# Patient Record
Sex: Male | Born: 1995 | Race: Black or African American | Hispanic: No | Marital: Single | Smoking: Former smoker
Health system: Southern US, Community
[De-identification: ages and names within clinical notes are randomized; demographics above are authoritative.]

## PROBLEM LIST (undated history)

## (undated) DIAGNOSIS — Z9109 Other allergy status, other than to drugs and biological substances: Secondary | ICD-10-CM

## (undated) DIAGNOSIS — I10 Essential (primary) hypertension: Secondary | ICD-10-CM

## (undated) HISTORY — PX: COLOSTOMY REVERSAL: SHX5782

## (undated) HISTORY — PX: OTHER SURGICAL HISTORY: SHX169

## (undated) HISTORY — PX: CARDIAC SURGERY: SHX584

## (undated) HISTORY — PX: SMALL INTESTINE SURGERY: SHX150

---
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
## 2016-09-21 ENCOUNTER — Emergency Department (HOSPITAL_COMMUNITY)
Admission: EM | Admit: 2016-09-21 | Discharge: 2016-09-21 | Disposition: A | Discharge status: Home Health | Payer: Medicaid Other | Attending: Emergency Medicine | Admitting: Emergency Medicine

## 2016-09-21 ENCOUNTER — Encounter (HOSPITAL_COMMUNITY): Payer: Self-pay | Admitting: Emergency Medicine

## 2016-09-21 DIAGNOSIS — E119 Type 2 diabetes mellitus without complications: Secondary | ICD-10-CM | POA: Insufficient documentation

## 2016-09-21 DIAGNOSIS — I10 Essential (primary) hypertension: Secondary | ICD-10-CM | POA: Insufficient documentation

## 2016-09-21 DIAGNOSIS — J45909 Unspecified asthma, uncomplicated: Secondary | ICD-10-CM | POA: Insufficient documentation

## 2016-09-21 DIAGNOSIS — Z7722 Contact with and (suspected) exposure to environmental tobacco smoke (acute) (chronic): Secondary | ICD-10-CM | POA: Insufficient documentation

## 2016-09-21 DIAGNOSIS — Z79899 Other long term (current) drug therapy: Secondary | ICD-10-CM | POA: Insufficient documentation

## 2016-09-21 DIAGNOSIS — J069 Acute upper respiratory infection, unspecified: Secondary | ICD-10-CM

## 2016-09-21 NOTE — Discharge Instructions (Signed)
Rest,  Drink plenty of fluids.  You may continue taking your over the counter products as discussed to help with symptoms as this is running its course.

## 2016-09-21 NOTE — ED Triage Notes (Signed)
C/o cough (green), sore throat for last 2 days.

## 2016-09-21 NOTE — ED Provider Notes (Signed)
[facility]-EMERGENCY DEPT Provider Note   CSN: [id number] Arrival date & time: 09/21/16  1715     History   Chief Complaint Chief Complaint  Patient presents with  . Sore Throat    HPI Steve Livingston is a 21 y.o. male  Presenting with a 2 day history of uri type symptoms which includes nasal congestion with clear rhinorrhea, sore throat, and nonproductive cough.  Symptoms do not include shortness of breath, chest pain,  Nausea, vomiting or diarrhea.  The patient has taken nighttime theraflu last night and tylenol severe cold formula today with significant improvement in symptoms.   The history is provided by the patient and a parent.    Past Medical History:  Diagnosis Date  . Asthma   . Diabetes mellitus   . Environmental allergies   . Hypertension     Patient Active Problem List   Diagnosis Date Noted  . Morbid obesity (HCC) 03/13/2012  . Prediabetes 03/13/2012  . Impaired glucose tolerance 03/13/2012  . Acanthosis 03/13/2012  . Gynecomastia 03/13/2012    Past Surgical History:  Procedure Laterality Date  . CARDIAC SURGERY    . SMALL INTESTINE SURGERY    . testicular removal         Home Medications    Prior to Admission medications   Medication Sig Start Date End Date Taking? Authorizing Provider  albuterol (PROVENTIL HFA;VENTOLIN HFA) 108 (90 Base) MCG/ACT inhaler Inhale 2 puffs into the lungs every 4 (four) hours as needed. 09/21/15   Devoria AlbeIva Knapp, MD  budesonide-formoterol (SYMBICORT) 80-4.5 MCG/ACT inhaler Inhale 2 puffs into the lungs 2 (two) times daily. 12/23/14   Hope Orlene OchM Neese, NP  loratadine (CLARITIN) 10 MG tablet Take 1 tablet (10 mg total) by mouth daily. 05/06/14   Jennifer Piepenbrink, PA-C  predniSONE (DELTASONE) 20 MG tablet Take 3 po QD x 3d , then 2 po QD x 3d then 1 po QD x 3d 09/21/15   Devoria AlbeIva Knapp, MD    Family History Family History  Problem Relation Age of Onset  . Hypertension Mother   . Obesity Mother   . Cancer Maternal Grandmother   .  Diabetes Maternal Grandmother   . Diabetes Maternal Grandfather   . Hypertension Maternal Grandfather   . Heart disease Maternal Grandfather   . Stroke Maternal Grandfather     Social History Social History  Substance Use Topics  . Smoking status: Passive Smoke Exposure - Never Smoker  . Smokeless tobacco: Current User     Comment: vapor  . Alcohol use No     Allergies   Bee venom   Review of Systems Review of Systems  Constitutional: Negative for chills and fever.  HENT: Positive for congestion, rhinorrhea and sore throat. Negative for ear pain, sinus pressure, trouble swallowing and voice change.   Eyes: Negative for discharge.  Respiratory: Positive for cough. Negative for shortness of breath, wheezing and stridor.   Cardiovascular: Negative for chest pain.  Gastrointestinal: Negative for abdominal pain.  Genitourinary: Negative.      Physical Exam Updated Vital Signs BP 130/69 (BP Location: Left Arm)   Pulse 88   Temp 98.3 F (36.8 C) (Oral)   Resp 16   Ht 5\' 11"  (1.803 m)   Wt 133.8 kg   SpO2 99%   BMI 41.14 kg/m   Physical Exam  Constitutional: He is oriented to person, place, and time. He appears well-developed and well-nourished.  HENT:  Head: Normocephalic and atraumatic.  Right Ear: Tympanic membrane and ear  canal normal.  Left Ear: Tympanic membrane and ear canal normal.  Nose: Mucosal edema and rhinorrhea present.  Mouth/Throat: Uvula is midline, oropharynx is clear and moist and mucous membranes are normal. No oropharyngeal exudate, posterior oropharyngeal edema, posterior oropharyngeal erythema or tonsillar abscesses.  Eyes: Conjunctivae are normal.  Cardiovascular: Normal rate and normal heart sounds.   Pulmonary/Chest: Effort normal. No respiratory distress. He has no wheezes. He has no rales.  Abdominal: Soft. There is no tenderness.  Musculoskeletal: Normal range of motion.  Neurological: He is alert and oriented to person, place, and time.   Skin: Skin is warm and dry. No rash noted.  Psychiatric: He has a normal mood and affect.     ED Treatments / Results  Labs (all labs ordered are listed, but only abnormal results are displayed) Labs Reviewed - No data to display  EKG  EKG Interpretation None       Radiology No results found.  Procedures Procedures (including critical care time)  Medications Ordered in ED Medications - No data to display   Initial Impression / Assessment and Plan / ED Course  I have reviewed the triage vital signs and the nursing notes.  Pertinent labs & imaging results that were available during my care of the patient were reviewed by me and considered in my medical decision making (see chart for details).     Reassurance given. Rest, increase fluid intake, prn f/u for any worsened sx.  No resp distress, ctab,  Exam c/w viral uri.  Final Clinical Impressions(s) / ED Diagnoses   Final diagnoses:  Upper respiratory tract infection, unspecified type    New Prescriptions New Prescriptions   No medications on file     Burgess Amor, PA-C 09/21/16 1828    Donnetta Hutching, MD 09/23/16 [phone]

## 2017-01-13 ENCOUNTER — Encounter (HOSPITAL_COMMUNITY): Payer: Self-pay

## 2017-01-13 ENCOUNTER — Emergency Department (HOSPITAL_COMMUNITY)
Admission: EM | Admit: 2017-01-13 | Discharge: 2017-01-13 | Disposition: A | Discharge status: Home / Self Care | Payer: BLUE CROSS/BLUE SHIELD | Attending: Emergency Medicine | Admitting: Emergency Medicine

## 2017-01-13 DIAGNOSIS — J4521 Mild intermittent asthma with (acute) exacerbation: Secondary | ICD-10-CM | POA: Diagnosis not present

## 2017-01-13 DIAGNOSIS — E119 Type 2 diabetes mellitus without complications: Secondary | ICD-10-CM | POA: Insufficient documentation

## 2017-01-13 DIAGNOSIS — R0602 Shortness of breath: Secondary | ICD-10-CM | POA: Diagnosis present

## 2017-01-13 DIAGNOSIS — Z7722 Contact with and (suspected) exposure to environmental tobacco smoke (acute) (chronic): Secondary | ICD-10-CM | POA: Insufficient documentation

## 2017-01-13 DIAGNOSIS — I10 Essential (primary) hypertension: Secondary | ICD-10-CM | POA: Diagnosis not present

## 2017-01-13 LAB — CBG MONITORING, ED: GLUCOSE-CAPILLARY: 104 mg/dL — AB (ref 65–99)

## 2017-01-13 MED ORDER — ALBUTEROL SULFATE (2.5 MG/3ML) 0.083% IN NEBU
5.0000 mg | INHALATION_SOLUTION | Freq: Once | RESPIRATORY_TRACT | Status: AC
  Administered 2017-01-13: 5 mg via RESPIRATORY_TRACT
  Filled 2017-01-13: qty 6

## 2017-01-13 MED ORDER — AEROCHAMBER Z-STAT PLUS/MEDIUM MISC
1.0000 | Freq: Once | Status: DC
  Filled 2017-01-13: qty 1

## 2017-01-13 MED ORDER — ALBUTEROL SULFATE HFA 108 (90 BASE) MCG/ACT IN AERS
2.0000 | INHALATION_SPRAY | Freq: Four times a day (QID) | RESPIRATORY_TRACT | Status: DC | PRN
  Filled 2017-01-13: qty 6.7

## 2017-01-13 MED ORDER — IPRATROPIUM BROMIDE 0.02 % IN SOLN
0.5000 mg | Freq: Once | RESPIRATORY_TRACT | Status: AC
  Administered 2017-01-13: 0.5 mg via RESPIRATORY_TRACT
  Filled 2017-01-13: qty 2.5

## 2017-01-13 MED ORDER — PREDNISONE 50 MG PO TABS
60.0000 mg | ORAL_TABLET | Freq: Once | ORAL | Status: AC
  Administered 2017-01-13: 60 mg via ORAL
  Filled 2017-01-13: qty 1

## 2017-01-13 MED ORDER — PREDNISONE 20 MG PO TABS
ORAL_TABLET | ORAL | 0 refills | Status: DC
Start: 2017-01-13 — End: 2017-09-09

## 2017-01-13 MED ORDER — ALBUTEROL SULFATE (2.5 MG/3ML) 0.083% IN NEBU
INHALATION_SOLUTION | RESPIRATORY_TRACT | Status: AC
  Administered 2017-01-13: 01:00:00
  Filled 2017-01-13: qty 6

## 2017-01-13 NOTE — ED Notes (Signed)
Pt alert & oriented x4, stable gait. Patient given discharge instructions, paperwork & prescription(s). Patient  instructed to stop at the registration desk to finish any additional paperwork. Patient verbalized understanding. Pt left department w/ no further questions. 

## 2017-01-13 NOTE — ED Notes (Signed)
Pt ambulated around the nurses station. O2 sats remained 95 to 98%

## 2017-01-13 NOTE — Discharge Instructions (Signed)
Use the inhaler 2 puffs every 6 hrs as needed for wheezing or shortness of breath. Take the prednisone until gone. Recheck if you get worse again.

## 2017-01-13 NOTE — ED Triage Notes (Signed)
Pt states he got hot at work with no air conditioning, states he started feeling sob, went home but did not improve.

## 2017-01-13 NOTE — ED Notes (Signed)
ED Provider at bedside. 

## 2017-01-13 NOTE — ED Provider Notes (Signed)
[facility]-EMERG[facility]Y DEPT Provider Note   CSN: [id number] Arrival date & time: 01/13/17  0040  By signing my name below, I, Doreatha Martin, attest that this documentation has been prepared under the direction and in the presence of Devoria Albe, MD. Electronically Signed: Doreatha Martin, ED Scribe. 01/13/17. 12:58 AM.   Time seen: 12:53 AM    History   Chief Complaint Chief Complaint  Patient presents with  . Shortness of Breath    HPI Steve Livingston is a 21 y.o. male with h/o asthma, borderline diabetes who presents to the Emergency Department complaining of gradually worsening SOB x 1 week with associated non-productive cough, sneezing. He states his SOB is unaffected by exertion. Pt states his current symptoms are consistent with prior asthma exacerbations, stating his symptoms are normally improved with inhalers. 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 He states he does not have a nebulizer or inhaler at home to use at this time. Pt states he has flare ups a few times a year, usually when it is hot. Pt is a non-smoker. He denies rhinorrhea, sore throat, nausea, vomiting, chest pain.    PCP- [facility]  The history is provided by the patient. No language interpreter was used.    Past Medical History:  Diagnosis Date  . Asthma   . Diabetes mellitus   . Environmental allergies   . Hypertension     Patient Active Problem List   Diagnosis Date Noted  . Morbid obesity (HCC) 03/13/2012  . Prediabetes 03/13/2012  . Impaired glucose tolerance 03/13/2012  . Acanthosis 03/13/2012  . Gynecomastia 03/13/2012    Past Surgical History:  Procedure Laterality Date  . CARDIAC SURGERY    . SMALL INTESTINE SURGERY    . testicular removal         Home Medications    Prior to Admission medications   Medication Sig Start Date End Date Taking? Authorizing Provider  albuterol (PROVENTIL HFA;VENTOLIN HFA) 108 (90 Base) MCG/ACT inhaler Inhale 2 puffs into the lungs every 4 (four)  hours as needed. 09/21/15   Devoria Albe, MD  budesonide-formoterol (SYMBICORT) 80-4.5 MCG/ACT inhaler Inhale 2 puffs into the lungs 2 (two) times daily. 12/23/14   Janne Napoleon, NP  loratadine (CLARITIN) 10 MG tablet Take 1 tablet (10 mg total) by mouth daily. 05/06/14   Piepenbrink, Victorino Dike, PA-C  predniSONE (DELTASONE) 20 MG tablet Take 3 po QD x 3d , then 2 po QD x 3d then 1 po QD x 3d 01/13/17   Devoria Albe, MD    Family History Family History  Problem Relation Age of Onset  . Hypertension Mother   . Obesity Mother   . Cancer Maternal Grandmother   . Diabetes Maternal Grandmother   . Diabetes Maternal Grandfather   . Hypertension Maternal Grandfather   . Heart disease Maternal Grandfather   . Stroke Maternal Grandfather     Social History Social History  Substance Use Topics  . Smoking status: Passive Smoke Exposure - Never Smoker  . Smokeless tobacco: Current User     Comment: vapor  . Alcohol use Yes  employed   Allergies   Bee venom   Review of Systems Review of Systems  HENT: Positive for sneezing. Negative for rhinorrhea and sore throat.   Respiratory: Positive for cough and shortness of breath.   Gastrointestinal: Negative for nausea and vomiting.  All other systems reviewed and are negative.    Physical Exam Updated Vital Signs BP 130/82 (BP Location: Left  Arm)   Pulse 74   Temp 97.9 F (36.6 C) (Oral)   Resp 18   Ht 5\' 11"  (1.803 m)   Wt (!) 330 lb (149.7 kg)   SpO2 98%   BMI 46.03 kg/m   Vital signs normal    Physical Exam  Constitutional: He is oriented to person, place, and time. He appears well-developed and well-nourished.  Non-toxic appearance. He does not appear ill. No distress.  HENT:  Head: Normocephalic and atraumatic.  Right Ear: External ear normal.  Left Ear: External ear normal.  Nose: Nose normal. No mucosal edema or rhinorrhea.  Mouth/Throat: Oropharynx is clear and moist and mucous membranes are normal. No dental abscesses or  uvula swelling.  Eyes: Conjunctivae and EOM are normal. Pupils are equal, round, and reactive to light.  Neck: Normal range of motion and full passive range of motion without pain. Neck supple.  Cardiovascular: Normal rate, regular rhythm and normal heart sounds.  Exam reveals no gallop and no friction rub.   No murmur heard. Pulmonary/Chest: Effort normal. No accessory muscle usage. No tachypnea. No respiratory distress. He has decreased breath sounds. He has wheezes. He has no rhonchi. He has no rales. He exhibits no tenderness and no crepitus.  Diminished breath sounds and scattered expiratory wheezing.   Abdominal: Soft. Normal appearance and bowel sounds are normal. He exhibits no distension. There is no tenderness. There is no rebound and no guarding.  Musculoskeletal: Normal range of motion. He exhibits no edema or tenderness.  Moves all extremities well.   Neurological: He is alert and oriented to person, place, and time. He has normal strength. No cranial nerve deficit.  Skin: Skin is warm, dry and intact. No rash noted. No erythema. No pallor.  Psychiatric: He has a normal mood and affect. His speech is normal and behavior is normal. His mood appears not anxious.  Nursing note and vitals reviewed.    ED Treatments / Results  Labs (all labs ordered are listed, but only abnormal results are displayed) Labs Reviewed  CBG MONITORING, ED - Abnormal; Notable for the following:       Result Value   Glucose-Capillary 104 (*)    All other components within normal limits   Laboratory interpretation all normal   EKG  EKG Interpretation None       Radiology No results found.  Procedures Procedures (including critical care time)  Medications Ordered in ED Medications  albuterol (PROVENTIL HFA;VENTOLIN HFA) 108 (90 Base) MCG/ACT inhaler 2 puff (not administered)  aerochamber Z-Stat Plus/medium 1 each (not administered)  albuterol (PROVENTIL) (2.5 MG/3ML) 0.083% nebulizer  solution 5 mg (5 mg Nebulization Given 01/13/17 0111)  ipratropium (ATROVENT) nebulizer solution 0.5 mg (0.5 mg Nebulization Given 01/13/17 0111)  albuterol (PROVENTIL) (2.5 MG/3ML) 0.083% nebulizer solution (  Given 01/13/17 0113)  albuterol (PROVENTIL) (2.5 MG/3ML) 0.083% nebulizer solution 5 mg (5 mg Nebulization Given 01/13/17 0146)  ipratropium (ATROVENT) nebulizer solution 0.5 mg (0.5 mg Nebulization Given 01/13/17 0146)  predniSONE (DELTASONE) tablet 60 mg (60 mg Oral Given 01/13/17 0135)     Initial Impression / Assessment and Plan / ED Course  I have reviewed the triage vital signs and the nursing notes.  Pertinent labs & imaging results that were available during my care of the patient were reviewed by me and considered in my medical decision making (see chart for details).     DIAGNOSTIC STUDIES: Oxygen Saturation is 98% on RA, normal by my interpretation.    COORDINATION OF  CARE: 12:57 AM Discussed treatment plan with pt at bedside which includes breathing tx and pt agreed to plan. Will check his CBG prior to starting steroids.     Recheck 01:25 AM Patient states is feeling better, when I listen to him he has some improvement in the air movement however he still has expiratory wheezing especially in the upper lobes bilaterally. A second nebulizer was ordered. I reviewed his CBG which was good, he was started on oral prednisone.  Recheck 2:40 AM patient is resting comfortably, he states he feels much improved. On exam he has good air movement and no longer has any wheezing. He was ambulated by nursing staff and his pulse ox remained 95-98% on room air. Patient was given an albuterol inhaler and spacer to use and he was instructed on how to use it by respiratory therapy. He was discharged home on prednisone.  Final Clinical Impressions(s) / ED Diagnoses   Final diagnoses:  Mild intermittent asthma with exacerbation    New Prescriptions New Prescriptions   PREDNISONE (DELTASONE)  20 MG TABLET    Take 3 po QD x 3d , then 2 po QD x 3d then 1 po QD x 3d   Plan discharge  Devoria Albe, MD, FACEP    I personally performed the services described in this documentation, which was scribed in my presence. The recorded information has been reviewed and considered.  Devoria Albe, MD, Concha Pyo, MD 01/13/17 [id number]

## 2017-01-29 ENCOUNTER — Encounter (HOSPITAL_COMMUNITY): Payer: Self-pay | Admitting: *Deleted

## 2017-01-29 ENCOUNTER — Emergency Department (HOSPITAL_COMMUNITY)
Admission: EM | Admit: 2017-01-29 | Discharge: 2017-01-29 | Disposition: A | Discharge status: Home / Self Care | Payer: BLUE CROSS/BLUE SHIELD | Attending: Emergency Medicine | Admitting: Emergency Medicine

## 2017-01-29 DIAGNOSIS — H1031 Unspecified acute conjunctivitis, right eye: Secondary | ICD-10-CM | POA: Diagnosis not present

## 2017-01-29 DIAGNOSIS — I10 Essential (primary) hypertension: Secondary | ICD-10-CM | POA: Insufficient documentation

## 2017-01-29 DIAGNOSIS — H579 Unspecified disorder of eye and adnexa: Secondary | ICD-10-CM | POA: Diagnosis present

## 2017-01-29 DIAGNOSIS — F17200 Nicotine dependence, unspecified, uncomplicated: Secondary | ICD-10-CM | POA: Insufficient documentation

## 2017-01-29 DIAGNOSIS — E119 Type 2 diabetes mellitus without complications: Secondary | ICD-10-CM | POA: Diagnosis not present

## 2017-01-29 DIAGNOSIS — J45909 Unspecified asthma, uncomplicated: Secondary | ICD-10-CM | POA: Diagnosis not present

## 2017-01-29 MED ORDER — TOBRAMYCIN 0.3 % OP SOLN: 1.0000 [drp] | OPHTHALMIC | 0 refills | Status: DC

## 2017-01-29 NOTE — ED Triage Notes (Signed)
Pt c/o right eye redness and drainage that started yesterday.

## 2017-01-29 NOTE — Discharge Instructions (Signed)
Return if any problems.

## 2017-01-29 NOTE — ED Provider Notes (Signed)
[facility] Provider Note   CSN: [id number] Arrival date & time: 01/29/17  1144     History   Chief Complaint Chief Complaint  Patient presents with  . Eye Drainage    HPI Steve Livingston is a 21 y.o. male.  The history is provided by the patient. No language interpreter was used.  Eye Pain  This is a new problem. The problem occurs constantly. The problem has been gradually worsening. Nothing aggravates the symptoms. Nothing relieves the symptoms. He has tried nothing for the symptoms. The treatment provided no relief.  Pt complains of eye redness and swelling   Past Medical History:  Diagnosis Date  . Asthma   . Diabetes mellitus   . Environmental allergies   . Hypertension     Patient Active Problem List   Diagnosis Date Noted  . Morbid obesity (HCC) 03/13/2012  . Prediabetes 03/13/2012  . Impaired glucose tolerance 03/13/2012  . Acanthosis 03/13/2012  . Gynecomastia 03/13/2012    Past Surgical History:  Procedure Laterality Date  . CARDIAC SURGERY    . SMALL INTESTINE SURGERY    . testicular removal         Home Medications    Prior to Admission medications   Medication Sig Start Date End Date Taking? Authorizing Provider  albuterol (PROVENTIL HFA;VENTOLIN HFA) 108 (90 Base) MCG/ACT inhaler Inhale 2 puffs into the lungs every 4 (four) hours as needed. 09/21/15   Devoria Albe, MD  budesonide-formoterol (SYMBICORT) 80-4.5 MCG/ACT inhaler Inhale 2 puffs into the lungs 2 (two) times daily. 12/23/14   Janne Napoleon, NP  loratadine (CLARITIN) 10 MG tablet Take 1 tablet (10 mg total) by mouth daily. 05/06/14   Piepenbrink, Victorino Dike, PA-C  predniSONE (DELTASONE) 20 MG tablet Take 3 po QD x 3d , then 2 po QD x 3d then 1 po QD x 3d 01/13/17   Devoria Albe, MD  tobramycin (TOBREX) 0.3 % ophthalmic solution Place 1 drop into the right eye every 4 (four) hours. 01/29/17   Elson Areas, PA-C    Family History Family History  Problem Relation Age of Onset  .  Hypertension Mother   . Obesity Mother   . Cancer Maternal Grandmother   . Diabetes Maternal Grandmother   . Diabetes Maternal Grandfather   . Hypertension Maternal Grandfather   . Heart disease Maternal Grandfather   . Stroke Maternal Grandfather     Social History Social History  Substance Use Topics  . Smoking status: Passive Smoke Exposure - Never Smoker  . Smokeless tobacco: Current User     Comment: vapor  . Alcohol use Yes     Allergies   Bee venom   Review of Systems Review of Systems  Eyes: Positive for pain.  All other systems reviewed and are negative.    Physical Exam Updated Vital Signs BP 135/79 (BP Location: Right Arm)   Pulse 81   Temp 97.7 F (36.5 C) (Oral)   Resp 20   Ht 5\' 11"  (1.803 m)   Wt (!) 149.7 kg (330 lb)   SpO2 97%   BMI 46.03 kg/m   Physical Exam  Constitutional: He appears well-developed and well-nourished.  HENT:  Head: Normocephalic and atraumatic.  Eyes: Conjunctivae are normal.  Injected conjunctiva, exudate corner of eye.  Neck: Neck supple.  Cardiovascular: Normal rate and regular rhythm.   No murmur heard. Pulmonary/Chest: Effort normal and breath sounds normal. No respiratory distress.  Abdominal: There is no tenderness.  Musculoskeletal: He exhibits no  edema.  Neurological: He is alert.  Skin: Skin is warm and dry.  Psychiatric: He has a normal mood and affect.  Nursing note and vitals reviewed.    ED Treatments / Results  Labs (all labs ordered are listed, but only abnormal results are displayed) Labs Reviewed - No data to display  EKG  EKG Interpretation None       Radiology No results found.  Procedures Procedures (including critical care time)  Medications Ordered in ED Medications - No data to display   Initial Impression / Assessment and Plan / ED Course  I have reviewed the triage vital signs and the nursing notes.  Pertinent labs & imaging results that were available during my care  of the patient were reviewed by me and considered in my medical decision making (see chart for details).       Final Clinical Impressions(s) / ED Diagnoses   Final diagnoses:  Acute conjunctivitis of right eye, unspecified acute conjunctivitis type    New Prescriptions Discharge Medication List as of 01/29/2017  1:16 PM    START taking these medications   Details  tobramycin (TOBREX) 0.3 % ophthalmic solution Place 1 drop into the right eye every 4 (four) hours., Starting Sun 01/29/2017, Print      An After Visit Summary was printed and given to the patient.   Elson AreasSofia, Amorita Vanrossum K, [place] JerseyPA-C 01/29/17 1351    Mesner, Barbara CowerJason, MD 01/29/17 [phone]

## 2017-01-29 NOTE — ED Notes (Signed)
Right eye  20/50 Left eye    20/40 

## 2017-09-09 ENCOUNTER — Emergency Department (HOSPITAL_COMMUNITY): Payer: Self-pay

## 2017-09-09 ENCOUNTER — Inpatient Hospital Stay (HOSPITAL_COMMUNITY)
Admission: EM | Admit: 2017-09-09 | Discharge: 2017-09-11 | DRG: 202 | Disposition: A | Discharge status: Home / Self Care | Payer: Self-pay | Attending: Internal Medicine | Admitting: Internal Medicine

## 2017-09-09 ENCOUNTER — Other Ambulatory Visit: Payer: Self-pay

## 2017-09-09 ENCOUNTER — Encounter (HOSPITAL_COMMUNITY): Payer: Self-pay | Admitting: *Deleted

## 2017-09-09 DIAGNOSIS — J45909 Unspecified asthma, uncomplicated: Secondary | ICD-10-CM | POA: Diagnosis present

## 2017-09-09 DIAGNOSIS — R51 Headache: Secondary | ICD-10-CM | POA: Diagnosis present

## 2017-09-09 DIAGNOSIS — R000 Tachycardia, unspecified: Secondary | ICD-10-CM | POA: Diagnosis present

## 2017-09-09 DIAGNOSIS — I493 Ventricular premature depolarization: Secondary | ICD-10-CM | POA: Diagnosis present

## 2017-09-09 DIAGNOSIS — R7303 Prediabetes: Secondary | ICD-10-CM | POA: Diagnosis present

## 2017-09-09 DIAGNOSIS — Z91030 Bee allergy status: Secondary | ICD-10-CM

## 2017-09-09 DIAGNOSIS — Z6841 Body Mass Index (BMI) 40.0 and over, adult: Secondary | ICD-10-CM

## 2017-09-09 DIAGNOSIS — J45901 Unspecified asthma with (acute) exacerbation: Principal | ICD-10-CM | POA: Diagnosis present

## 2017-09-09 DIAGNOSIS — Z79899 Other long term (current) drug therapy: Secondary | ICD-10-CM

## 2017-09-09 DIAGNOSIS — Z720 Tobacco use: Secondary | ICD-10-CM

## 2017-09-09 DIAGNOSIS — Z23 Encounter for immunization: Secondary | ICD-10-CM

## 2017-09-09 DIAGNOSIS — R9431 Abnormal electrocardiogram [ECG] [EKG]: Secondary | ICD-10-CM | POA: Diagnosis present

## 2017-09-09 DIAGNOSIS — R509 Fever, unspecified: Secondary | ICD-10-CM | POA: Diagnosis present

## 2017-09-09 DIAGNOSIS — I10 Essential (primary) hypertension: Secondary | ICD-10-CM | POA: Diagnosis present

## 2017-09-09 DIAGNOSIS — R7302 Impaired glucose tolerance (oral): Secondary | ICD-10-CM | POA: Diagnosis present

## 2017-09-09 DIAGNOSIS — J4521 Mild intermittent asthma with (acute) exacerbation: Secondary | ICD-10-CM

## 2017-09-09 DIAGNOSIS — E66813 Obesity, class 3: Secondary | ICD-10-CM

## 2017-09-09 DIAGNOSIS — J9601 Acute respiratory failure with hypoxia: Secondary | ICD-10-CM

## 2017-09-09 DIAGNOSIS — Z7951 Long term (current) use of inhaled steroids: Secondary | ICD-10-CM

## 2017-09-09 LAB — CBC WITH DIFFERENTIAL/PLATELET
BASOS ABS: 0 10*3/uL (ref 0.0–0.1)
BASOS PCT: 0 %
Eosinophils Absolute: 0 10*3/uL (ref 0.0–0.7)
Eosinophils Relative: 0 %
HEMATOCRIT: 41.8 % (ref 39.0–52.0)
HEMOGLOBIN: 13.3 g/dL (ref 13.0–17.0)
Lymphocytes Relative: 7 %
Lymphs Abs: 0.4 10*3/uL — ABNORMAL LOW (ref 0.7–4.0)
MCH: 28.1 pg (ref 26.0–34.0)
MCHC: 31.8 g/dL (ref 30.0–36.0)
MCV: 88.2 fL (ref 78.0–100.0)
MONO ABS: 0.1 10*3/uL (ref 0.1–1.0)
Monocytes Relative: 2 %
NEUTROS ABS: 5.7 10*3/uL (ref 1.7–7.7)
Neutrophils Relative %: 91 %
Platelets: 238 10*3/uL (ref 150–400)
RBC: 4.74 MIL/uL (ref 4.22–5.81)
RDW: 13.3 % (ref 11.5–15.5)
WBC: 6.2 10*3/uL (ref 4.0–10.5)

## 2017-09-09 LAB — COMPREHENSIVE METABOLIC PANEL
ALBUMIN: 4.2 g/dL (ref 3.5–5.0)
ALK PHOS: 96 U/L (ref 38–126)
ALT: 20 U/L (ref 17–63)
AST: 24 U/L (ref 15–41)
Anion gap: 13 (ref 5–15)
BILIRUBIN TOTAL: 0.9 mg/dL (ref 0.3–1.2)
BUN: 8 mg/dL (ref 6–20)
CALCIUM: 9.1 mg/dL (ref 8.9–10.3)
CO2: 25 mmol/L (ref 22–32)
CREATININE: 0.85 mg/dL (ref 0.61–1.24)
Chloride: 99 mmol/L — ABNORMAL LOW (ref 101–111)
GFR calc Af Amer: >60 mL/min (ref >60)
GLUCOSE: 131 mg/dL — AB (ref 65–99)
POTASSIUM: 3.6 mmol/L (ref 3.5–5.1)
Sodium: 137 mmol/L (ref 135–145)
TOTAL PROTEIN: 8.5 g/dL — AB (ref 6.5–8.1)

## 2017-09-09 LAB — CBG MONITORING, ED: GLUCOSE-CAPILLARY: 78 mg/dL (ref 65–99)

## 2017-09-09 LAB — MAGNESIUM: MAGNESIUM: 1.9 mg/dL (ref 1.7–2.4)

## 2017-09-09 MED ORDER — INFLUENZA VAC SPLIT QUAD 0.5 ML IM SUSY
0.5000 mL | PREFILLED_SYRINGE | INTRAMUSCULAR | Status: AC
  Administered 2017-09-10: 0.5 mL via INTRAMUSCULAR
  Filled 2017-09-09: qty 0.5

## 2017-09-09 MED ORDER — IPRATROPIUM BROMIDE 0.02 % IN SOLN: 0.5000 mg | Freq: Four times a day (QID) | RESPIRATORY_TRACT | Status: DC

## 2017-09-09 MED ORDER — ONDANSETRON HCL 4 MG PO TABS: 4.0000 mg | ORAL_TABLET | Freq: Four times a day (QID) | ORAL | Status: DC | PRN

## 2017-09-09 MED ORDER — IPRATROPIUM-ALBUTEROL 0.5-2.5 (3) MG/3ML IN SOLN
3.0000 mL | Freq: Once | RESPIRATORY_TRACT | Status: AC
  Administered 2017-09-09: 3 mL via RESPIRATORY_TRACT
  Filled 2017-09-09: qty 3

## 2017-09-09 MED ORDER — PNEUMOCOCCAL VAC POLYVALENT 25 MCG/0.5ML IJ INJ
0.5000 mL | INJECTION | INTRAMUSCULAR | Status: AC
  Administered 2017-09-10: 0.5 mL via INTRAMUSCULAR
  Filled 2017-09-09: qty 0.5

## 2017-09-09 MED ORDER — METHYLPREDNISOLONE SODIUM SUCC 125 MG IJ SOLR
125.0000 mg | Freq: Two times a day (BID) | INTRAMUSCULAR | Status: DC
  Administered 2017-09-09: 125 mg via INTRAVENOUS
  Filled 2017-09-09: qty 2

## 2017-09-09 MED ORDER — ALBUTEROL SULFATE (2.5 MG/3ML) 0.083% IN NEBU
2.5000 mg | INHALATION_SOLUTION | Freq: Once | RESPIRATORY_TRACT | Status: AC
  Administered 2017-09-09: 2.5 mg via RESPIRATORY_TRACT
  Filled 2017-09-09: qty 3

## 2017-09-09 MED ORDER — ALBUTEROL SULFATE (2.5 MG/3ML) 0.083% IN NEBU: 2.5000 mg | INHALATION_SOLUTION | RESPIRATORY_TRACT | Status: DC | PRN

## 2017-09-09 MED ORDER — IPRATROPIUM-ALBUTEROL 0.5-2.5 (3) MG/3ML IN SOLN
3.0000 mL | Freq: Four times a day (QID) | RESPIRATORY_TRACT | Status: DC
  Administered 2017-09-09 – 2017-09-10 (×4): 3 mL via RESPIRATORY_TRACT
  Filled 2017-09-09 (×4): qty 3

## 2017-09-09 MED ORDER — ALBUTEROL (5 MG/ML) CONTINUOUS INHALATION SOLN
10.0000 mg/h | INHALATION_SOLUTION | Freq: Once | RESPIRATORY_TRACT | Status: AC
  Administered 2017-09-09: 10 mg/h via RESPIRATORY_TRACT
  Filled 2017-09-09: qty 20

## 2017-09-09 MED ORDER — HYDROXYZINE HCL 25 MG PO TABS
50.0000 mg | ORAL_TABLET | Freq: Every evening | ORAL | Status: DC | PRN
  Administered 2017-09-09: 50 mg via ORAL
  Filled 2017-09-09: qty 2

## 2017-09-09 MED ORDER — ONDANSETRON HCL 4 MG/2ML IJ SOLN: 4.0000 mg | Freq: Four times a day (QID) | INTRAMUSCULAR | Status: DC | PRN

## 2017-09-09 MED ORDER — ALBUTEROL SULFATE HFA 108 (90 BASE) MCG/ACT IN AERS
2.0000 | INHALATION_SPRAY | Freq: Once | RESPIRATORY_TRACT | Status: DC
  Filled 2017-09-09: qty 6.7

## 2017-09-09 MED ORDER — MAGNESIUM SULFATE 2 GM/50ML IV SOLN
2.0000 g | Freq: Once | INTRAVENOUS | Status: AC
  Administered 2017-09-09: 2 g via INTRAVENOUS
  Filled 2017-09-09: qty 50

## 2017-09-09 MED ORDER — ALBUTEROL SULFATE (2.5 MG/3ML) 0.083% IN NEBU: 2.5000 mg | INHALATION_SOLUTION | Freq: Four times a day (QID) | RESPIRATORY_TRACT | Status: DC

## 2017-09-09 MED ORDER — ENOXAPARIN SODIUM 80 MG/0.8ML ~~LOC~~ SOLN
80.0000 mg | SUBCUTANEOUS | Status: DC
  Administered 2017-09-09: 23:00:00 80 mg via SUBCUTANEOUS
  Filled 2017-09-09: qty 0.8

## 2017-09-09 MED ORDER — PREDNISONE 10 MG PO TABS
60.0000 mg | ORAL_TABLET | Freq: Once | ORAL | Status: AC
  Administered 2017-09-09: 60 mg via ORAL
  Filled 2017-09-09: qty 1

## 2017-09-09 MED ORDER — GUAIFENESIN ER 600 MG PO TB12
600.0000 mg | ORAL_TABLET | Freq: Two times a day (BID) | ORAL | Status: DC
  Administered 2017-09-09 – 2017-09-11 (×4): 600 mg via ORAL
  Filled 2017-09-09 (×4): qty 1

## 2017-09-09 MED ORDER — POTASSIUM CHLORIDE IN NACL 20-0.45 MEQ/L-% IV SOLN
INTRAVENOUS | Status: AC
  Administered 2017-09-09 – 2017-09-10 (×2): via INTRAVENOUS
  Filled 2017-09-09 (×4): qty 1000

## 2017-09-09 MED ORDER — ALBUTEROL SULFATE (2.5 MG/3ML) 0.083% IN NEBU: 10.0000 mg | INHALATION_SOLUTION | Freq: Once | RESPIRATORY_TRACT | Status: DC

## 2017-09-09 NOTE — ED Provider Notes (Signed)
  Patient is a 22 year old male with a known history of asthma, complaining of cough, shortness of breath with wheezing for 2 days.  Signed out to me by Burgess AmorJulie Idol, PA-C at end of shift.    Patient was receiving hour-long albuterol nebulizer treatment.  He has received oral prednisone.  1815 patient has completed albuterol treatment, on my assessment lung sounds are slightly diminished with few expiratory wheezes.  Patient states that he is feeling better and requesting discharge home.  On ambulation in the department his O2 sats on RA remain at the 88-90% range.    1850 patient also seen by Dr. Hyacinth MeekerMiller, patient initially requesting to be discharged home, but on further discussion patient now agrees to admission.  1925 Consulted hospitalist, Dr. Robb Matarrtiz, who agrees to admit.      Pauline Ausriplett, Reisa Coppola, PA-C 09/09/17 Serena Croissant1928    Eber HongMiller, Brian, MD 09/10/17 1500

## 2017-09-09 NOTE — ED Triage Notes (Signed)
Pt with productive cough for 2 days, green in color.  Unknown of fever, denies N/V/D.  Decrease appetite

## 2017-09-09 NOTE — ED Notes (Signed)
Sats 88-92% on RA during ambulation.  Denies SOB.  Says, "I feel better.  HR 120's.

## 2017-09-09 NOTE — ED Notes (Signed)
Sats 86-91% on RA during ambulation.  Denies SOB.

## 2017-09-09 NOTE — ED Provider Notes (Signed)
[facility] EMERGENCY DEPARTMENT Provider Note   CSN: [id number] Arrival date & time: 09/09/17  1331     History   Chief Complaint Chief Complaint  Patient presents with  . Cough    HPI Steve Livingston is a 22 y.o. male with a history of asthma and borderline DM, diet controlled,  but without current medications for this presenting with a 2 day history of cough productive of green thick sputum, shortness of breath and wheezing with exertion and possible fevers, stating he felt hot a few times with coughing. He also reports nasal congestion also with purulent nasal discharge.  He denies hemoptysis.  He has had no chest pain, denies n/v or abdominal pain.  He reports feeling lightheaded earlier today with ambulation and sob.  He has had no treatment prior to arrival, stating he tried using his albuterol but it was empty.  The history is provided by the patient.    Past Medical History:  Diagnosis Date  . Asthma   . Diabetes mellitus   . Environmental allergies   . Hypertension     Patient Active Problem List   Diagnosis Date Noted  . Morbid obesity (HCC) 03/13/2012  . Prediabetes 03/13/2012  . Impaired glucose tolerance 03/13/2012  . Acanthosis 03/13/2012  . Gynecomastia 03/13/2012    Past Surgical History:  Procedure Laterality Date  . CARDIAC SURGERY    . SMALL INTESTINE SURGERY    . testicular removal         Home Medications    Prior to Admission medications   Medication Sig Start Date End Date Taking? Authorizing Provider  albuterol (PROVENTIL HFA;VENTOLIN HFA) 108 (90 Base) MCG/ACT inhaler Inhale 2 puffs into the lungs every 4 (four) hours as needed. 09/21/15   Devoria Albe, MD  budesonide-formoterol (SYMBICORT) 80-4.5 MCG/ACT inhaler Inhale 2 puffs into the lungs 2 (two) times daily. 12/23/14   Janne Napoleon, NP  loratadine (CLARITIN) 10 MG tablet Take 1 tablet (10 mg total) by mouth daily. 05/06/14   Piepenbrink, Victorino Dike, PA-C  predniSONE (DELTASONE) 20 MG  tablet Take 3 po QD x 3d , then 2 po QD x 3d then 1 po QD x 3d 01/13/17   Devoria Albe, MD  tobramycin (TOBREX) 0.3 % ophthalmic solution Place 1 drop into the right eye every 4 (four) hours. 01/29/17   Elson Areas, PA-C    Family History Family History  Problem Relation Age of Onset  . Hypertension Mother   . Obesity Mother   . Cancer Maternal Grandmother   . Diabetes Maternal Grandmother   . Diabetes Maternal Grandfather   . Hypertension Maternal Grandfather   . Heart disease Maternal Grandfather   . Stroke Maternal Grandfather     Social History Social History   Tobacco Use  . Smoking status: Passive Smoke Exposure - Never Smoker  . Smokeless tobacco: Current User  . Tobacco comment: vapor  Substance Use Topics  . Alcohol use: Yes  . Drug use: No     Allergies   Bee venom   Review of Systems Review of Systems  Constitutional: Negative for fever.  HENT: Positive for congestion and rhinorrhea. Negative for sore throat.   Eyes: Negative.   Respiratory: Positive for chest tightness, shortness of breath and wheezing.   Cardiovascular: Negative for chest pain.  Gastrointestinal: Negative for abdominal pain, nausea and vomiting.  Genitourinary: Negative.   Musculoskeletal: Negative for arthralgias, joint swelling and neck pain.  Skin: Negative.  Negative for rash and wound.  Neurological: Positive for light-headedness. Negative for dizziness, weakness, numbness and headaches.  Psychiatric/Behavioral: Negative.      Physical Exam Updated Vital Signs BP 116/64   Pulse (!) 108   Temp 99.6 F (37.6 C) (Oral)   Resp 20   Ht 5\' 11"  (1.803 m)   Wt (!) 149.7 kg (330 lb)   SpO2 93%   BMI 46.03 kg/m   Physical Exam  Constitutional: He appears well-developed and well-nourished.  HENT:  Head: Normocephalic and atraumatic.  Eyes: Conjunctivae are normal.  Neck: Normal range of motion.  Cardiovascular: Regular rhythm, normal heart sounds and intact distal pulses.  Tachycardia present.  Pulmonary/Chest: Effort normal. He has decreased breath sounds. He has wheezes in the right upper field. He has no rhonchi. He has no rales.  Decreased breath sounds throughout all lung fields.  There is a faint wheeze in his right upper lung field heard best with coughing.  Abdominal: Soft. Bowel sounds are normal. There is no tenderness.  Musculoskeletal: Normal range of motion.  Neurological: He is alert.  Skin: Skin is warm and dry.  Psychiatric: He has a normal mood and affect.  Nursing note and vitals reviewed.    ED Treatments / Results  Labs (all labs ordered are listed, but only abnormal results are displayed) Labs Reviewed  CBG MONITORING, ED    EKG  EKG Interpretation None       Radiology Dg Chest 2 View  Result Date: 09/09/2017 CLINICAL DATA:  Productive cough for 2 days. No reported fever or vomiting. History of asthma and diabetes. EXAM: CHEST  2 VIEW COMPARISON:  09/21/2015. FINDINGS: The heart size and mediastinal contours are stable. There is possible mild infrahilar scarring on the left, similar to the previous study. No hyperinflation, confluent airspace opacity, pleural effusion or pneumothorax. No acute osseous findings. IMPRESSION: Stable chest.  No acute cardiopulmonary process. Electronically Signed   By: Carey BullocksWilliam  Veazey M.D.   On: 09/09/2017 14:31    Procedures Procedures (including critical care time)  Medications Ordered in ED Medications  albuterol (PROVENTIL) (2.5 MG/3ML) 0.083% nebulizer solution 2.5 mg (2.5 mg Nebulization Given 09/09/17 1442)  ipratropium-albuterol (DUONEB) 0.5-2.5 (3) MG/3ML nebulizer solution 3 mL (3 mLs Nebulization Given 09/09/17 1442)  predniSONE (DELTASONE) tablet 60 mg (60 mg Oral Given 09/09/17 1608)  albuterol (PROVENTIL,VENTOLIN) solution continuous neb (10 mg/hr Nebulization Given 09/09/17 1654)     Initial Impression / Assessment and Plan / ED Course  I have reviewed the triage vital signs and  the nursing notes.  Pertinent labs & imaging results that were available during my care of the patient were reviewed by me and considered in my medical decision making (see chart for details).     Patient was given an albuterol and Atrovent nebulizer treatment.  He states he felt improved after this treatment.  On exam, I did not appreciate any significant improvement in aeration, no wheezing heard.  He was ambulated in the department and he had saturations in the 88-92% range.  He also is tachycardic, although has just had an albuterol treatment.  He was given prednisone 60 mg p.o. and an hour-long albuterol neb was ordered.  Discussed patient with Pauline Ausammy Triplett PA who will follow pt.  Final Clinical Impressions(s) / ED Diagnoses   Final diagnoses:  Moderate asthma with exacerbation, unspecified whether persistent    ED Discharge Orders    None       Victoriano Laindol, Joesph Marcy, PA-C 09/09/17 1759    Eber HongMiller, Brian, MD 09/09/17  1900    Eber Hong, MD 09/10/17 1500

## 2017-09-09 NOTE — Progress Notes (Signed)
Respiratory Care Noted: Notified of breathing treatment for patient. Attempted to get clarification on treatment. 10 mg is usually given as an continuous neb treatment over one hour.

## 2017-09-09 NOTE — ED Notes (Signed)
Report given to Sheena, RN.

## 2017-09-09 NOTE — ED Notes (Signed)
In to reassess pt.  Dr Hyacinth MeekerMiller has spoken with pt and informed Dr Hyacinth MeekerMiller that he wants to go home.  Mother in room with pt and pt decided to stay overnight.  Tammy Triplett informed.

## 2017-09-09 NOTE — H&P (Signed)
History and Physical    Steve Livingston WUJ:[id number] DOB: Feb 19, 1996 DOA: 09/09/2017  PCP: Patient, No Pcp Per   Patient coming from: Home.  I have personally briefly reviewed patient's old medical records in [facility]  Chief Complaint: Shortness of breath.  HPI: Steve Livingston is a 22 y.o. male with medical history significant of asthma, prediabetes/DM, environmental allergies, hypertension, morbid obesity is coming to the emergency department with complaints of productive cough of greenish sputum for the past 2 days associated with dyspnea, wheezing, fatigue and decreased appetite.  He denies fever, chills, headache, rhinorrhea, but states he had a sore throat several days ago and has nasal congestion now.  He denies travel history or sick contacts.  No chest pain, palpitations, dizziness, diaphoresis, PND, orthopnea or pitting edema lower extremities.  No abdominal pain, nausea, emesis, diarrhea, constipation, melena or hematochezia.  Denies dysuria, frequency or hematuria.  Denies blurred vision, polyphagia, polydipsia or polyuria.  He denies skin rashes.  ED Course: Initial vital signs temperature 99.58F (37.6 C), pulse 108, respirations 20, blood pressure 116/64 mmHg and O2 sat 95% on room air.  His workup shows a normal CBC.  CMP shows a chloride of 99 mmol/L and glucose of 131 mg/dL, but was otherwise normal.  His chest radiograph did not show any acute cardiopulmonary pathology.  However EKG shows sinus tach at 100 bpm, with minimal ST depression and borderline ST elevation in lateral leads.  Treatment in the ED.  The patient received supplemental oxygen, a regular DuoNeb and 1 hour continuous neb treatment plus 60 mg of prednisone p.o. x1  Review of Systems: As per HPI otherwise 10 point review of systems negative.    Past Medical History:  Diagnosis Date  . Asthma   . Diabetes mellitus   . Environmental allergies   . Hypertension     Past Surgical History:  Procedure  Laterality Date  . CARDIAC SURGERY    . SMALL INTESTINE SURGERY    . testicular removal       reports that he is a non-smoker but has been exposed to tobacco smoke. He uses smokeless tobacco. He reports that he drinks alcohol. He reports that he does not use drugs.  Allergies  Allergen Reactions  . Bee Venom Swelling    Family History  Problem Relation Age of Onset  . Hypertension Mother   . Obesity Mother   . Cancer Maternal Grandmother   . Diabetes Maternal Grandmother   . Diabetes Maternal Grandfather   . Hypertension Maternal Grandfather   . Heart disease Maternal Grandfather   . Stroke Maternal Grandfather     Prior to Admission medications   Medication Sig Start Date End Date Taking? Authorizing Provider  albuterol (PROVENTIL HFA;VENTOLIN HFA) 108 (90 Base) MCG/ACT inhaler Inhale 2 puffs into the lungs every 4 (four) hours as needed. 09/21/15  Yes Devoria Albe, MD    Physical Exam: Vitals:   09/09/17 1337 09/09/17 1443 09/09/17 1656 09/09/17 1841  BP: 116/64   117/71  Pulse: (!) 108   (!) 107  Resp: 20   18  Temp: 99.6 F (37.6 C)     TempSrc: Oral     SpO2: 95% 96% 93% (!) 88%  Weight:      Height:        Constitutional: NAD, calm, comfortable Eyes: PERRL, lids and conjunctivae normal ENMT: Mucous membranes are moist. Posterior pharynx clear of any exudate or lesions. Neck: normal, supple, no masses, no thyromegaly Respiratory: Decreased breath  sounds on bases, but no wheezing, no crackles. Normal respiratory effort. No accessory muscle use.  Cardiovascular: Regular rate and rhythm, no murmurs / rubs / gallops. No extremity edema. 2+ pedal pulses. No carotid bruits.  Abdomen: Obese, soft, no tenderness, no masses palpated. No hepatosplenomegaly. Bowel sounds positive.  Musculoskeletal: no clubbing / cyanosis. No joint deformity upper and lower extremities. Good ROM, no contractures. Normal muscle tone.  Skin: no rashes, lesions, ulcers on limited dermatological  exam. Neurologic: CN 2-12 grossly intact. Sensation intact, DTR normal. Strength 5/5 in all 4.  Psychiatric: Normal judgment and insight. Alert and oriented x 4. Normal mood.    Labs on Admission: I have personally reviewed following labs and imaging studies  CBC: No results for input(s): WBC, NEUTROABS, HGB, HCT, MCV, PLT in the last 168 hours. Basic Metabolic Panel: No results for input(s): NA, K, CL, CO2, GLUCOSE, BUN, CREATININE, CALCIUM, MG, PHOS in the last 168 hours. GFR: CrCl cannot be calculated (No order found.). Liver Function Tests: No results for input(s): AST, ALT, ALKPHOS, BILITOT, PROT, ALBUMIN in the last 168 hours. No results for input(s): LIPASE, AMYLASE in the last 168 hours. No results for input(s): AMMONIA in the last 168 hours. Coagulation Profile: No results for input(s): INR, PROTIME in the last 168 hours. Cardiac Enzymes: No results for input(s): CKTOTAL, CKMB, CKMBINDEX, TROPONINI in the last 168 hours. BNP (last 3 results) No results for input(s): PROBNP in the last 8760 hours. HbA1C: No results for input(s): HGBA1C in the last 72 hours. CBG: Recent Labs  Lab 09/09/17 1607  GLUCAP 78   Lipid Profile: No results for input(s): CHOL, HDL, LDLCALC, TRIG, CHOLHDL, LDLDIRECT in the last 72 hours. Thyroid Function Tests: No results for input(s): TSH, T4TOTAL, FREET4, T3FREE, THYROIDAB in the last 72 hours. Anemia Panel: No results for input(s): VITAMINB12, FOLATE, FERRITIN, TIBC, IRON, RETICCTPCT in the last 72 hours. Urine analysis: No results found for: COLORURINE, APPEARANCEUR, LABSPEC, PHURINE, GLUCOSEU, HGBUR, BILIRUBINUR, KETONESUR, PROTEINUR, UROBILINOGEN, NITRITE, LEUKOCYTESUR  Radiological Exams on Admission: Dg Chest 2 View  Result Date: 09/09/2017 CLINICAL DATA:  Productive cough for 2 days. No reported fever or vomiting. History of asthma and diabetes. EXAM: CHEST  2 VIEW COMPARISON:  09/21/2015. FINDINGS: The heart size and mediastinal  contours are stable. There is possible mild infrahilar scarring on the left, similar to the previous study. No hyperinflation, confluent airspace opacity, pleural effusion or pneumothorax. No acute osseous findings. IMPRESSION: Stable chest.  No acute cardiopulmonary process. Electronically Signed   By: Carey BullocksWilliam  Veazey M.D.   On: 09/09/2017 14:31    EKG: Independently reviewed.  Vent. rate 100 BPM PR interval * ms QRS duration 103 ms QT/QTc 346/447 ms P-R-T axes 67 37 4 Sinus tachycardia Minimal ST depression Borderline ST elevation, lateral leads No previous to compare to.  Assessment/Plan Principal Problem:   Asthma exacerbation Observation/telemetry. Continue supplemental oxygen. Albuterol 2.5+ ipratropium 0.5 mg neb every 6 hours scheduled. Albuterol 2.5 mg neb every 12 hours as needed. He has received 60 mg of prednisone in the ED. Will give Solu-Medrol 125 mg IVP every 6 hours x2 doses starting at midnight. Close blood glucose monitoring and control while on glucocorticoids.  Active Problems:   Prediabetes Carbohydrate modified diet. CBG monitoring with regular insulin sliding scale while on steroids. Check hemoglobin A1c. Advised to establish with a new PCP. Told about the need to get yearly microalbuminuria and retinal checks. Advised to continue with New Year's resolutions in regards to lifestyle changes.  Hypertension Currently not on medical therapy. Monitor blood pressure.    Abnormal EKG Asymptomatic. Advised to follow up with PCP. Should consider outpatient cardiology evaluation.    DVT prophylaxis: Lovenox SQ. Code Status: Full code. Family Communication:  Disposition Plan: Observation for asthma exacerbation treatment. Consults called:  Admission status: Inpatient/telemetry.   Bobette Mo MD [facility] Pager [phone].  If 7PM-7AM, please contact night-coverage [email or web address] Password [facility]  09/09/2017, 7:49 PM

## 2017-09-10 DIAGNOSIS — R7303 Prediabetes: Secondary | ICD-10-CM

## 2017-09-10 DIAGNOSIS — R9431 Abnormal electrocardiogram [ECG] [EKG]: Secondary | ICD-10-CM

## 2017-09-10 DIAGNOSIS — J45901 Unspecified asthma with (acute) exacerbation: Principal | ICD-10-CM

## 2017-09-10 DIAGNOSIS — J9600 Acute respiratory failure, unspecified whether with hypoxia or hypercapnia: Secondary | ICD-10-CM

## 2017-09-10 DIAGNOSIS — J9601 Acute respiratory failure with hypoxia: Secondary | ICD-10-CM

## 2017-09-10 DIAGNOSIS — R7309 Other abnormal glucose: Secondary | ICD-10-CM

## 2017-09-10 LAB — RESPIRATORY PANEL BY PCR
ADENOVIRUS-RVPPCR: NOT DETECTED
Bordetella pertussis: NOT DETECTED
CORONAVIRUS 229E-RVPPCR: NOT DETECTED
CORONAVIRUS NL63-RVPPCR: NOT DETECTED
CORONAVIRUS OC43-RVPPCR: NOT DETECTED
Chlamydophila pneumoniae: NOT DETECTED
Coronavirus HKU1: NOT DETECTED
Influenza A: NOT DETECTED
Influenza B: NOT DETECTED
MYCOPLASMA PNEUMONIAE-RVPPCR: NOT DETECTED
Metapneumovirus: NOT DETECTED
PARAINFLUENZA VIRUS 1-RVPPCR: NOT DETECTED
Parainfluenza Virus 2: NOT DETECTED
Parainfluenza Virus 3: NOT DETECTED
Parainfluenza Virus 4: NOT DETECTED
Respiratory Syncytial Virus: NOT DETECTED
Rhinovirus / Enterovirus: NOT DETECTED

## 2017-09-10 LAB — BASIC METABOLIC PANEL
ANION GAP: 10 (ref 5–15)
BUN: 9 mg/dL (ref 6–20)
CO2: 25 mmol/L (ref 22–32)
Calcium: 9.1 mg/dL (ref 8.9–10.3)
Chloride: 101 mmol/L (ref 101–111)
Creatinine, Ser: 0.76 mg/dL (ref 0.61–1.24)
GLUCOSE: 159 mg/dL — AB (ref 65–99)
POTASSIUM: 4.5 mmol/L (ref 3.5–5.1)
Sodium: 136 mmol/L (ref 135–145)

## 2017-09-10 LAB — GLUCOSE, CAPILLARY
GLUCOSE-CAPILLARY: 119 mg/dL — AB (ref 65–99)
GLUCOSE-CAPILLARY: 201 mg/dL — AB (ref 65–99)
GLUCOSE-CAPILLARY: 163 mg/dL — AB (ref 65–99)
GLUCOSE-CAPILLARY: 126 mg/dL — AB (ref 65–99)

## 2017-09-10 LAB — HEMOGLOBIN A1C
HEMOGLOBIN A1C: 5.7 % — AB (ref 4.8–5.6)
Mean Plasma Glucose: 116.89 mg/dL

## 2017-09-10 LAB — MAGNESIUM: Magnesium: 2.4 mg/dL (ref 1.7–2.4)

## 2017-09-10 MED ORDER — INSULIN ASPART 100 UNIT/ML ~~LOC~~ SOLN
0.0000 [IU] | Freq: Three times a day (TID) | SUBCUTANEOUS | Status: DC
  Administered 2017-09-10: 3 [IU] via SUBCUTANEOUS
  Administered 2017-09-10: 5 [IU] via SUBCUTANEOUS
  Administered 2017-09-11: 2 [IU] via SUBCUTANEOUS

## 2017-09-10 MED ORDER — BUDESONIDE 0.5 MG/2ML IN SUSP
0.5000 mg | Freq: Two times a day (BID) | RESPIRATORY_TRACT | Status: DC
  Administered 2017-09-10 – 2017-09-11 (×3): 0.5 mg via RESPIRATORY_TRACT
  Filled 2017-09-10 (×3): qty 2

## 2017-09-10 MED ORDER — METHYLPREDNISOLONE SODIUM SUCC 125 MG IJ SOLR
80.0000 mg | Freq: Three times a day (TID) | INTRAMUSCULAR | Status: DC
  Administered 2017-09-10 – 2017-09-11 (×4): 80 mg via INTRAVENOUS
  Filled 2017-09-10 (×4): qty 2

## 2017-09-10 MED ORDER — ENOXAPARIN SODIUM 80 MG/0.8ML ~~LOC~~ SOLN
70.0000 mg | SUBCUTANEOUS | Status: DC
  Administered 2017-09-10: 70 mg via SUBCUTANEOUS
  Filled 2017-09-10: qty 0.8

## 2017-09-10 NOTE — Discharge Summary (Addendum)
Physician Discharge Summary  Steve HakimZachary Livingston VHQ:[id number] DOB: May 14, 1996 DOA: 09/09/2017  PCP: Patient, No Pcp Per  Admit date: 09/09/2017 Discharge date: 09/11/2017  Admitted From: Home Disposition:  Home   Recommendations for Outpatient Follow-up:  1. Follow up with PCP in 1-2 weeks 2. Please obtain BMP/CBC in one week    Discharge Condition: Stable CODE STATUS: FULL Diet recommendation: Heart Healthy   Brief/Interim Summary: 22 year old male with a history of asthma, impaired glucose tolerance, and elevated blood pressure presenting with 5-6 days of coughing, sore throat, and shortness of breath.  He stated that it began around 09/04/2017 with sore throat and coughing and subsequently progressing to dyspnea on exertion.  Over the next 4-5 days, his symptoms continue to progress to the point where he was having difficulty walking without shortness of breath.  The patient also had some headaches without any neurologic changes.  The patient had some subjective fevers and chills, but denied any nausea, vomiting, diarrhea, abdominal pain, dysuria, hematuria.  In the emergency department, the patient had a low-grade fever and mild tachycardia.  He received albuterol and Solu-Medrol.  However, he desaturated to 88% with ambulation.  As result, admission was recommended.  The patient was started on intravenous Solu-Medrol and bronchodilators with clinical improvement.  On the morning of discharge,I ambulated the patient on room air without desaturation less than 88%.    Discharge Diagnoses:   Acute respiratory failure with hypoxia -Presently stable on 1 L nasal cannula>>RA -Secondary to asthma exacerbation -Wean oxygen back to room air as tolerated for saturation greater than 92% -ambulatory pulse ox on RA--did not show any oxygen desaturation less than 88%.  Asthma exacerbation -Respiratory viral panel--negative -Started Pulmicort -Continue IV Solu-Medrol>>>home with prednisone  taper -Continue bronchodilators -Personally reviewed chest x-ray--increased interstitial markings, no consolidation -check mag--2.4  Impaired glucose tolerance -Check hemoglobin A1c--5.7 -NovoLog sliding scale while on steroids -consult nutrtition  Abnormal EKG  -Likely due to early repolarization -No chest pain -Personally reviewed EKG--sinus rhythm, nonspecific ST-T wave changes  Morbid Obesity  -BMI 41.65     Discharge Instructions   Allergies as of 09/11/2017      Reactions   Bee Venom Swelling      Medication List    TAKE these medications   albuterol 108 (90 Base) MCG/ACT inhaler Commonly known as:  PROVENTIL HFA;VENTOLIN HFA Inhale 2 puffs into the lungs every 4 (four) hours as needed.   predniSONE 10 MG tablet Commonly known as:  DELTASONE Take 6 tablets (60 mg total) by mouth daily with breakfast. And decrease by one tablet daily       Allergies  Allergen Reactions  . Bee Venom Swelling    Consultations:  none   Procedures/Studies: Dg Chest 2 View  Result Date: 09/09/2017 CLINICAL DATA:  Productive cough for 2 days. No reported fever or vomiting. History of asthma and diabetes. EXAM: CHEST  2 VIEW COMPARISON:  09/21/2015. FINDINGS: The heart size and mediastinal contours are stable. There is possible mild infrahilar scarring on the left, similar to the previous study. No hyperinflation, confluent airspace opacity, pleural effusion or pneumothorax. No acute osseous findings. IMPRESSION: Stable chest.  No acute cardiopulmonary process. Electronically Signed   By: Carey BullocksWilliam  Veazey M.D.   On: 09/09/2017 14:31        Discharge Exam: Vitals:   09/11/17 0719 09/11/17 0724  BP:    Pulse:    Resp:    Temp:    SpO2: (!) 87% (!) 87%   Vitals:  09/10/17 2125 09/11/17 0512 09/11/17 0719 09/11/17 0724  BP: (!) 148/53 (!) 113/54    Pulse: (!) 101 67    Resp: 17 16    Temp: 97.7 F (36.5 C) (!) 97.5 F (36.4 C)    TempSrc: Oral Oral     SpO2: 98% 96% (!) 87% (!) 87%  Weight:      Height:        General: Pt is alert, awake, not in acute distress Cardiovascular: RRR, S1/S2 +, no rubs, no gallops Respiratory: CTA bilaterally, no wheezing, no rhonchi Abdominal: Soft, NT, ND, bowel sounds + Extremities: no edema, no cyanosis   The results of significant diagnostics from this hospitalization (including imaging, microbiology, ancillary and laboratory) are listed below for reference.    Significant Diagnostic Studies: Dg Chest 2 View  Result Date: 09/09/2017 CLINICAL DATA:  Productive cough for 2 days. No reported fever or vomiting. History of asthma and diabetes. EXAM: CHEST  2 VIEW COMPARISON:  09/21/2015. FINDINGS: The heart size and mediastinal contours are stable. There is possible mild infrahilar scarring on the left, similar to the previous study. No hyperinflation, confluent airspace opacity, pleural effusion or pneumothorax. No acute osseous findings. IMPRESSION: Stable chest.  No acute cardiopulmonary process. Electronically Signed   By: Carey Bullocks M.D.   On: 09/09/2017 14:31     Microbiology: Recent Results (from the past 240 hour(s))  Respiratory Panel by PCR     Status: None   Collection Time: 09/10/17  7:36 AM  Result Value Ref Range Status   Adenovirus NOT DETECTED NOT DETECTED Final   Coronavirus 229E NOT DETECTED NOT DETECTED Final   Coronavirus HKU1 NOT DETECTED NOT DETECTED Final   Coronavirus NL63 NOT DETECTED NOT DETECTED Final   Coronavirus OC43 NOT DETECTED NOT DETECTED Final   Metapneumovirus NOT DETECTED NOT DETECTED Final   Rhinovirus / Enterovirus NOT DETECTED NOT DETECTED Final   Influenza A NOT DETECTED NOT DETECTED Final   Influenza B NOT DETECTED NOT DETECTED Final   Parainfluenza Virus 1 NOT DETECTED NOT DETECTED Final   Parainfluenza Virus 2 NOT DETECTED NOT DETECTED Final   Parainfluenza Virus 3 NOT DETECTED NOT DETECTED Final   Parainfluenza Virus 4 NOT DETECTED NOT DETECTED  Final   Respiratory Syncytial Virus NOT DETECTED NOT DETECTED Final   Bordetella pertussis NOT DETECTED NOT DETECTED Final   Chlamydophila pneumoniae NOT DETECTED NOT DETECTED Final   Mycoplasma pneumoniae NOT DETECTED NOT DETECTED Final    Comment: Performed at [facility], [address], [place], [place] [address]     Labs: Basic Metabolic Panel: Recent Labs  Lab 09/09/17 1949 09/10/17 0623  NA 137 136  K 3.6 4.5  CL 99* 101  CO2 25 25  GLUCOSE 131* 159*  BUN 8 9  CREATININE 0.85 0.76  CALCIUM 9.1 9.1  MG 1.9 2.4   Liver Function Tests: Recent Labs  Lab 09/09/17 1949  AST 24  ALT 20  ALKPHOS 96  BILITOT 0.9  PROT 8.5*  ALBUMIN 4.2   No results for input(s): LIPASE, AMYLASE in the last 168 hours. No results for input(s): AMMONIA in the last 168 hours. CBC: Recent Labs  Lab 09/09/17 1949  WBC 6.2  NEUTROABS 5.7  HGB 13.3  HCT 41.8  MCV 88.2  PLT 238   Cardiac Enzymes: No results for input(s): CKTOTAL, CKMB, CKMBINDEX, TROPONINI in the last 168 hours. BNP: Invalid input(s): POCBNP CBG: Recent Labs  Lab 09/10/17 1114 09/10/17 1632 09/10/17 2128  09/11/17 0748 09/11/17 1131  GLUCAP 201* 163* 126* 135* 208*    Time coordinating discharge:  Greater than 30 minutes  Signed:  Catarina Hartshorn, DO [facility] Pager: [phone] 09/11/2017, 11:32 AM

## 2017-09-10 NOTE — Progress Notes (Signed)
PROGRESS NOTE  Steve Livingston [id number] DOB: Jul 13, 1996 DOA: 09/09/2017 PCP: Patient, No Pcp Per  Brief History:  22 year old male with a history of asthma, impaired glucose tolerance, and elevated blood pressure presenting with 5-6 days of coughing, sore throat, and shortness of breath.  He stated that it began around 09/04/2017 with sore throat and coughing and subsequently progressing to dyspnea on exertion.  Over the next 4-5 days, his symptoms continue to progress to the point where he was having difficulty walking without shortness of breath.  The patient also had some headaches without any neurologic changes.  The patient had some subjective fevers and chills, but denied any nausea, vomiting, diarrhea, abdominal pain, dysuria, hematuria.  In the emergency department, the patient had a low-grade fever and mild tachycardia.  He received albuterol and Solu-Medrol.  However, he desaturated to 88% with ambulation.  As result, admission was recommended.  Assessment/Plan: Acute respiratory failure with hypoxia -Presently stable on 1 L nasal cannula -Secondary to asthma exacerbation -Wean oxygen back to room air as tolerated for saturation greater than 92%  Asthma exacerbation -Respiratory viral panel -Start Pulmicort -Continue IV Solu-Medrol -Continue bronchodilators -Personally reviewed chest x-ray--increased interstitial markings, no consolidation -check mag  Impaired glucose tolerance -Check hemoglobin A1c -NovoLog sliding scale while on steroids -consult nutrtition  Abnormal EKG  -Likely due to early repolarization -No chest pain -Personally reviewed EKG--sinus rhythm, nonspecific ST-T wave changes  Morbid Obesity  -BMI 41.65 -consult nutrition   Disposition Plan:   Home in 1-2 days  Family Communication:  No Family at bedside  Consultants:  none  Code Status:  FULL   DVT Prophylaxis:  [place] Lovenox   Procedures: As Listed in Progress Note  Above  Antibiotics: None    Subjective: Patient continues to complain of cough and some dyspnea on exertion.  He denies any chest pain, nausea, vomiting, diarrhea, abdominal pain, dysuria, hematuria.  He has an occasional headache without any visual disturbance or focal extremity weakness.  Objective: Vitals:   09/09/17 2023 09/09/17 2041 09/09/17 2044 09/10/17 0508  BP: (!) 145/101   130/67  Pulse: 95  91 69  Resp: 18  18 18   Temp: 99.3 F (37.4 C)   98 F (36.7 C)  TempSrc: Oral   Oral  SpO2: 92% (!) 88% (!) 88% (!) 89%  Weight: 135.4 kg (298 lb 8.1 oz)     Height: 5\' 11"  (1.803 m)       Intake/Output Summary (Last 24 hours) at 09/10/2017 0736 Last data filed at 09/10/2017 0600 Gross per 24 hour  Intake 1177.5 ml  Output -  Net 1177.5 ml   Weight change:  Exam:   General:  Pt is alert, follows commands appropriately, not in acute distress  HEENT: No icterus, No thrush, No neck mass, [place]/AT  Cardiovascular: RRR, S1/S2, no rubs, no gallops  Respiratory: Bibasilar rales.  Minimal bibasilar wheeze.  Diminished breath sounds bilateral.  Abdomen: Soft/+BS, non tender, non distended, no guarding  Extremities: No edema, No lymphangitis, No petechiae, No rashes, no synovitis   Data Reviewed: I have personally reviewed following labs and imaging studies Basic Metabolic Panel: Recent Labs  Lab 09/09/17 1949  NA 137  K 3.6  CL 99*  CO2 25  GLUCOSE 131*  BUN 8  CREATININE 0.85  CALCIUM 9.1  MG 1.9   Liver Function Tests: Recent Labs  Lab 09/09/17 1949  AST 24  ALT 20  ALKPHOS 96  BILITOT 0.9  PROT 8.5*  ALBUMIN 4.2   No results for input(s): LIPASE, AMYLASE in the last 168 hours. No results for input(s): AMMONIA in the last 168 hours. Coagulation Profile: No results for input(s): INR, PROTIME in the last 168 hours. CBC: Recent Labs  Lab 09/09/17 1949  WBC 6.2  NEUTROABS 5.7  HGB 13.3  HCT 41.8  MCV 88.2  PLT 238   Cardiac Enzymes: No  results for input(s): CKTOTAL, CKMB, CKMBINDEX, TROPONINI in the last 168 hours. BNP: Invalid input(s): POCBNP CBG: Recent Labs  Lab 09/09/17 1607  GLUCAP 78   HbA1C: No results for input(s): HGBA1C in the last 72 hours. Urine analysis: No results found for: COLORURINE, APPEARANCEUR, LABSPEC, PHURINE, GLUCOSEU, HGBUR, BILIRUBINUR, KETONESUR, PROTEINUR, UROBILINOGEN, NITRITE, LEUKOCYTESUR Sepsis Labs: @LABRCNTIP (procalcitonin:4,lacticidven:4) )No results found for this or any previous visit (from the past 240 hour(s)).   Scheduled Meds: . enoxaparin (LOVENOX) injection  80 mg Subcutaneous Q24H  . guaiFENesin  600 mg Oral BID  . Influenza vac split quadrivalent PF  0.5 mL Intramuscular Tomorrow-1000  . ipratropium-albuterol  3 mL Nebulization Q6H  . methylPREDNISolone (SOLU-MEDROL) injection  80 mg Intravenous Q8H  . pneumococcal 23 valent vaccine  0.5 mL Intramuscular Tomorrow-1000   Continuous Infusions: . 0.45 % NaCl with KCl 20 mEq / L 125 mL/hr at 09/09/17 2254    Procedures/Studies: Dg Chest 2 View  Result Date: 09/09/2017 CLINICAL DATA:  Productive cough for 2 days. No reported fever or vomiting. History of asthma and diabetes. EXAM: CHEST  2 VIEW COMPARISON:  09/21/2015. FINDINGS: The heart size and mediastinal contours are stable. There is possible mild infrahilar scarring on the left, similar to the previous study. No hyperinflation, confluent airspace opacity, pleural effusion or pneumothorax. No acute osseous findings. IMPRESSION: Stable chest.  No acute cardiopulmonary process. Electronically Signed   By: Carey Bullocks M.D.   On: 09/09/2017 14:31    Catarina Hartshorn, DO  [facility] Pager [phone]  If 7PM-7AM, please contact night-coverage [email or web address] Password [email or web address] 09/10/2017, 7:36 AM   LOS: 0 days

## 2017-09-11 DIAGNOSIS — J45909 Unspecified asthma, uncomplicated: Secondary | ICD-10-CM | POA: Diagnosis present

## 2017-09-11 LAB — HIV ANTIBODY (ROUTINE TESTING W REFLEX): HIV Screen 4th Generation wRfx: NONREACTIVE

## 2017-09-11 LAB — GLUCOSE, CAPILLARY
GLUCOSE-CAPILLARY: 135 mg/dL — AB (ref 65–99)
Glucose-Capillary: 208 mg/dL — ABNORMAL HIGH (ref 65–99)

## 2017-09-11 MED ORDER — IPRATROPIUM-ALBUTEROL 0.5-2.5 (3) MG/3ML IN SOLN
3.0000 mL | Freq: Three times a day (TID) | RESPIRATORY_TRACT | Status: DC
  Administered 2017-09-11: 3 mL via RESPIRATORY_TRACT
  Filled 2017-09-11: qty 3

## 2017-09-11 MED ORDER — ALBUTEROL SULFATE HFA 108 (90 BASE) MCG/ACT IN AERS: 2.0000 | INHALATION_SPRAY | RESPIRATORY_TRACT | 0 refills | Status: DC | PRN

## 2017-09-11 MED ORDER — PREDNISONE 10 MG PO TABS: 60.0000 mg | ORAL_TABLET | Freq: Every day | ORAL | 0 refills | Status: DC

## 2017-09-11 NOTE — Progress Notes (Signed)
  RD consulted for nutrition education regarding pre-diabetes.   Lab Results  Component Value Date   HGBA1C 5.7 (H) 09/09/2017    RD provided "Carbohydrate Counting for People with Diabetes" handout from the Academy of [employer] and Dietetics. Discussed different food groups and their effects on blood sugar, emphasizing carbohydrate-containing foods. Provided list of carbohydrates and recommended serving sizes of common foods.  Discussed importance of controlled and consistent carbohydrate intake throughout the day.  Provided examples of ways to balance meals/snacks and encouraged intake of high-fiber, whole grain complex carbohydrates. Teach back method used.  Expect good compliance. He was receptive to education and plans to implement decreasing the amount of sugar-containing beverages he drinks.    Body mass index is 41.63 kg/m. Pt meets criteria for morbid obesity based on current BMI.  Current diet order is , patient is consuming approximately 100% of meals at this time. Labs and medications reviewed. No further nutrition interventions warranted at this time. RD contact information provided. If additional nutrition issues arise, please re-consult RD.  Royann ShiversLynn Donika Butner MS,RD,CSG,LDN Office: [phone] Pager: [phone]

## 2017-09-11 NOTE — Progress Notes (Signed)
Patient discharged home.  IV removed - WNL.  AVS and meds reviewed with patient, verbalizes understanding. Patient has no PCP at this time, list of MDs in area given.  No questions at this time - assisted off unit in NAD.

## 2017-09-11 NOTE — Plan of Care (Signed)
Patient has had an uneventful night. His breathing has improved. His vitals remain stable. His blood sugars are stable. Patient is voiding appropriately. He verbalizes understanding of all education. No complaints noted at this time, will continue to monitor patient.

## 2017-09-11 NOTE — Discharge Instructions (Signed)
Asthma Attack Prevention, Adult °Although you may not be able to control the fact that you have asthma, you can take actions to prevent episodes of asthma (asthma attacks). These actions include: °· Creating a written plan for managing and treating your asthma attacks (asthma action plan). °· Monitoring your asthma. °· Avoiding things that can irritate your airways or make your asthma symptoms worse (asthma triggers). °· Taking your medicines as directed. °· Acting quickly if you have signs or symptoms of an asthma attack. ° °What are some ways to prevent an asthma attack? °Create a plan °Work with your health care provider to create an asthma action plan. This plan should include: °· A list of your asthma triggers and how to avoid them. °· A list of symptoms that you experience during an asthma attack. °· Information about when to take medicine and how much medicine to take. °· Information to help you understand your peak flow measurements. °· Contact information for your health care providers. °· Daily actions that you can take to control asthma. ° °Monitor your asthma ° °To monitor your asthma: °· Use your peak flow meter every morning and every evening for 2-3 weeks. Record the results in a journal. A drop in your peak flow numbers on one or more days may mean that you are starting to have an asthma attack, even if you are not having symptoms. °· When you have asthma symptoms, write them down in a journal. ° °Avoid asthma triggers ° °Work with your health care provider to find out what your asthma triggers are. This can be done by: °· Being tested for allergies. °· Keeping a journal that notes when asthma attacks occur and what may have contributed to them. °· Asking your health care provider whether other medical conditions make your asthma worse. ° °Common asthma triggers include: °· Dust. °· Smoke. This includes campfire smoke and secondhand smoke from tobacco products. °· Pet dander. °· Trees, grasses or  pollens. °· Very cold, dry, or humid air. °· Mold. °· Foods that contain high amounts of sulfites. °· Strong smells. °· Engine exhaust and air pollution. °· Aerosol sprays and fumes from household cleaners. °· Household pests and their droppings, including dust mites and cockroaches. °· Certain medicines, including NSAIDs. ° °Once you have determined your asthma triggers, take steps to avoid them. Depending on your triggers, you may be able to reduce the chance of an asthma attack by: °· Keeping your home clean. Have someone dust and vacuum your home for you 1 or 2 times a week. If possible, have them use a high-efficiency particulate arrestance (HEPA) vacuum. °· Washing your sheets weekly in hot water. °· Using allergy-proof mattress covers and casings on your bed. °· Keeping pets out of your home. °· Taking care of mold and water problems in your home. °· Avoiding areas where people smoke. °· Avoiding using strong perfumes or odor sprays. °· Avoid spending a lot of time outdoors when pollen counts are high and on very windy days. °· Talking with your health care provider before stopping or starting any new medicines. ° °Medicines °Take over-the-counter and prescription medicines only as told by your health care provider. Many asthma attacks can be prevented by carefully following your medicine schedule. Taking your medicines correctly is especially important when you cannot avoid certain asthma triggers. Even if you are doing well, do not stop taking your medicine and do not take less medicine. °Act quickly °If an asthma attack happens, acting quickly   can decrease how severe it is and how long it lasts. Take these actions: °· Pay attention to your symptoms. If you are coughing, wheezing, or having difficulty breathing, do not wait to see if your symptoms go away on their own. Follow your asthma action plan. °· If you have followed your asthma action plan and your symptoms are not improving, call your health care  provider or seek immediate medical care at the nearest hospital. ° °It is important to write down how often you need to use your fast-acting rescue inhaler. You can track how often you use an inhaler in your journal. If you are using your rescue inhaler more often, it may mean that your asthma is not under control. Adjusting your asthma treatment plan may help you to prevent future asthma attacks and help you to gain better control of your condition. °How can I prevent an asthma attack when I exercise? ° °Exercise is a common asthma trigger. To prevent asthma attacks during exercise: °· Follow advice from your health care provider about whether you should use your fast-acting inhaler before exercising. Many people with asthma experience exercise-induced bronchoconstriction (EIB). This condition often worsens during vigorous exercise in cold, humid, or dry environments. Usually, people with EIB can stay very active by using a fast-acting inhaler before exercising. °· Avoid exercising outdoors in very cold or humid weather. °· Avoid exercising outdoors when pollen counts are high. °· Warm up and cool down when exercising. °· Stop exercising right away if asthma symptoms start. ° °Consider taking part in exercises that are less likely to cause asthma symptoms such as: °· Indoor swimming. °· Biking. °· Walking. °· Hiking. °· Playing football. ° °This information is not intended to replace advice given to you by your health care provider. Make sure you discuss any questions you have with your health care provider. °Document Released: 07/27/2009 Document Revised: 04/08/2016 Document Reviewed: 01/23/2016 °Elsevier Interactive Patient Education © 2018 [employer] ° ° °Asthma, Adult °Asthma is a condition of the lungs in which the airways tighten and narrow. Asthma can make it hard to breathe. Asthma cannot be cured, but medicine and lifestyle changes can help control it. Asthma may be started (triggered) by: °· Animal  skin flakes (dander). °· Dust. °· Cockroaches. °· Pollen. °· Mold. °· Smoke. °· Cleaning products. °· Hair sprays or aerosol sprays. °· Paint fumes or strong smells. °· Cold air, weather changes, and winds. °· Crying or laughing hard. °· Stress. °· Certain medicines or drugs. °· Foods, such as dried fruit, potato chips, and sparkling grape juice. °· Infections or conditions (colds, flu). °· Exercise. °· Certain medical conditions or diseases. °· Exercise or tiring activities. ° °Follow these instructions at home: °· Take medicine as told by your doctor. °· Use a peak flow meter as told by your doctor. A peak flow meter is a tool that measures how well the lungs are working. °· Record and keep track of the peak flow meter's readings. °· Understand and use the asthma action plan. An asthma action plan is a written plan for taking care of your asthma and treating your attacks. °· To help prevent asthma attacks: °? Do not smoke. Stay away from secondhand smoke. °? Change your heating and air conditioning filter often. °? Limit your use of fireplaces and wood stoves. °? Get rid of pests (such as roaches and mice) and their droppings. °? Throw away plants if you see mold on them. °? Clean your floors. Dust regularly.   Use cleaning products that do not smell. °? Have someone vacuum when you are not home. Use a vacuum cleaner with a HEPA filter if possible. °? Replace carpet with wood, tile, or vinyl flooring. Carpet can trap animal skin flakes and dust. °? Use allergy-proof pillows, mattress covers, and box spring covers. °? Wash bed sheets and blankets every week in hot water and dry them in a dryer. °? Use blankets that are made of polyester or cotton. °? Clean bathrooms and kitchens with bleach. If possible, have someone repaint the walls in these rooms with mold-resistant paint. Keep out of the rooms that are being cleaned and painted. °? Wash hands often. °Contact a doctor if: °· You have make a whistling sound when  breaking (wheeze), have shortness of breath, or have a cough even if taking medicine to prevent attacks. °· The colored mucus you cough up (sputum) is thicker than usual. °· The colored mucus you cough up changes from clear or white to yellow, green, gray, or bloody. °· You have problems from the medicine you are taking such as: °? A rash. °? Itching. °? Swelling. °? Trouble breathing. °· You need reliever medicines more than 2-3 times a week. °· Your peak flow measurement is still at 50-79% of your personal best after following the action plan for 1 hour. °· You have a fever. °Get help right away if: °· You seem to be worse and are not responding to medicine during an asthma attack. °· You are short of breath even at rest. °· You get short of breath when doing very little activity. °· You have trouble eating, drinking, or talking. °· You have chest pain. °· You have a fast heartbeat. °· Your lips or fingernails start to turn blue. °· You are light-headed, dizzy, or faint. °· Your peak flow is less than 50% of your personal best. °This information is not intended to replace advice given to you by your health care provider. Make sure you discuss any questions you have with your health care provider. °Document Released: 01/25/2008 Document Revised: 01/14/2016 Document Reviewed: 03/07/2013 °Elsevier Interactive Patient Education © 2017 Elsevier Inc. ° °

## 2017-09-11 NOTE — Care Management Note (Addendum)
Case Management Note  Patient Details  Name: Steve Livingston MRN: [id number] Date of Birth: 1996/06/22  Subjective/Objective:  Adm with Asthma exacerbation. Ind, works full time at [employer]. Patient does not have insurance or PCP. Discussed oxygen and inhaler. Patient reports he can afford inhaler. Patient can not receive oxygen as he does not have a PCP.                   Action/Plan: Anticipate DC home. Will Provide with Provider list. Patient will be responsible for establishing care.   Expected Discharge Date:       09/12/2016           Expected Discharge Plan:  Home/Self Care  In-House Referral:     Discharge planning Services  CM Consult  Post Acute Care Choice:  NA Choice offered to:  NA  DME Arranged:    DME Agency:     HH Arranged:    HH Agency:     Status of Service:  Completed, signed off  If discussed at [employer] Length [employer] Stay Meetings, dates discussed:    Additional Comments:  Shelsea Hangartner, Chrystine OilerSharley Diane, RN 09/11/2017, 10:39 AM

## 2018-02-24 ENCOUNTER — Emergency Department (HOSPITAL_COMMUNITY)
Admission: EM | Admit: 2018-02-24 | Discharge: 2018-02-24 | Disposition: A | Discharge status: Home / Self Care | Payer: Self-pay | Attending: Emergency Medicine | Admitting: Emergency Medicine

## 2018-02-24 ENCOUNTER — Encounter (HOSPITAL_COMMUNITY): Payer: Self-pay | Admitting: Emergency Medicine

## 2018-02-24 DIAGNOSIS — Z79899 Other long term (current) drug therapy: Secondary | ICD-10-CM | POA: Insufficient documentation

## 2018-02-24 DIAGNOSIS — I10 Essential (primary) hypertension: Secondary | ICD-10-CM | POA: Insufficient documentation

## 2018-02-24 DIAGNOSIS — X500XXA Overexertion from strenuous movement or load, initial encounter: Secondary | ICD-10-CM | POA: Insufficient documentation

## 2018-02-24 DIAGNOSIS — Y999 Unspecified external cause status: Secondary | ICD-10-CM | POA: Insufficient documentation

## 2018-02-24 DIAGNOSIS — Y929 Unspecified place or not applicable: Secondary | ICD-10-CM | POA: Insufficient documentation

## 2018-02-24 DIAGNOSIS — J45909 Unspecified asthma, uncomplicated: Secondary | ICD-10-CM | POA: Insufficient documentation

## 2018-02-24 DIAGNOSIS — S39012A Strain of muscle, fascia and tendon of lower back, initial encounter: Secondary | ICD-10-CM | POA: Insufficient documentation

## 2018-02-24 DIAGNOSIS — E119 Type 2 diabetes mellitus without complications: Secondary | ICD-10-CM | POA: Insufficient documentation

## 2018-02-24 DIAGNOSIS — Y939 Activity, unspecified: Secondary | ICD-10-CM | POA: Insufficient documentation

## 2018-02-24 MED ORDER — IBUPROFEN 600 MG PO TABS: 600.0000 mg | ORAL_TABLET | Freq: Four times a day (QID) | ORAL | 0 refills | Status: DC | PRN

## 2018-02-24 MED ORDER — CYCLOBENZAPRINE HCL 10 MG PO TABS: 10.0000 mg | ORAL_TABLET | Freq: Two times a day (BID) | ORAL | 0 refills | Status: DC | PRN

## 2018-02-24 NOTE — ED Provider Notes (Signed)
[facility] EMERGENCY DEPARTMENT Provider Note   CSN: [id number] Arrival date & time: 02/24/18  1914     History   Chief Complaint Chief Complaint  Patient presents with  . Back Pain    HPI Steve Livingston is a 22 y.o. male.  HPI Patient presents to the emergency room with complaints of low back pain.  Patient states he has had it for the last several weeks now.  The symptoms come and go.  He feels like his back needs to pop and generally after it does that he will feel better for period of time.  More recently seems to be coming more frequently.  The pain is in lower back.  Sometimes it goes down towards his right hip.  He denies any abdominal pain.  No difficulty urinating.  No numbness or weakness. Past Medical History:  Diagnosis Date  . Asthma   . Diabetes mellitus   . Environmental allergies   . Hypertension     Patient Active Problem List   Diagnosis Date Noted  . Asthma 09/11/2017  . Acute respiratory failure with hypoxemia (HCC) 09/10/2017  . Obesity, Class III, BMI 40-49.9 (morbid obesity) (HCC) 09/10/2017  . Asthma exacerbation 09/09/2017  . Hypertension 09/09/2017  . Abnormal EKG 09/09/2017  . Morbid obesity (HCC) 03/13/2012  . Prediabetes 03/13/2012  . Impaired glucose tolerance 03/13/2012  . Acanthosis 03/13/2012  . Gynecomastia 03/13/2012    Past Surgical History:  Procedure Laterality Date  . CARDIAC SURGERY    . SMALL INTESTINE SURGERY    . testicular removal          Home Medications    Prior to Admission medications   Medication Sig Start Date End Date Taking? Authorizing Provider  albuterol (PROVENTIL HFA;VENTOLIN HFA) 108 (90 Base) MCG/ACT inhaler Inhale 2 puffs into the lungs every 4 (four) hours as needed. 09/11/17   Catarina Hartshorn, MD  cyclobenzaprine (FLEXERIL) 10 MG tablet Take 1 tablet (10 mg total) by mouth 2 (two) times daily as needed for muscle spasms. 02/24/18   Linwood Dibbles, MD  ibuprofen (ADVIL,MOTRIN) 600 MG tablet Take 1 tablet (600  mg total) by mouth every 6 (six) hours as needed. 02/24/18   Linwood Dibbles, MD  predniSONE (DELTASONE) 10 MG tablet Take 6 tablets (60 mg total) by mouth daily with breakfast. And decrease by one tablet daily 09/11/17   Catarina Hartshorn, MD    Family History Family History  Problem Relation Age of Onset  . Hypertension Mother   . Obesity Mother   . Cancer Maternal Grandmother   . Diabetes Maternal Grandmother   . Diabetes Maternal Grandfather   . Hypertension Maternal Grandfather   . Heart disease Maternal Grandfather   . Stroke Maternal Grandfather     Social History Social History   Tobacco Use  . Smoking status: Never Smoker  . Smokeless tobacco: Never Used  . Tobacco comment: vapor  Substance Use Topics  . Alcohol use: Yes  . Drug use: No     Allergies   Bee venom   Review of Systems Review of Systems  All other systems reviewed and are negative.    Physical Exam Updated Vital Signs BP 131/69 (BP Location: Right Arm)   Pulse 67   Temp 98 F (36.7 C) (Oral)   Resp 16   Ht 1.829 m (6')   Wt (!) 142.9 kg (315 lb)   SpO2 98%   BMI 42.72 kg/m   Physical Exam  Constitutional: He appears well-developed and  well-nourished. No distress.  HENT:  Head: Normocephalic and atraumatic.  Right Ear: External ear normal.  Left Ear: External ear normal.  Eyes: Conjunctivae are normal. Right eye exhibits no discharge. Left eye exhibits no discharge. No scleral icterus.  Neck: Neck supple. No tracheal deviation present.  Cardiovascular: Normal rate.  Pulmonary/Chest: Effort normal. No stridor. No respiratory distress.  Abdominal: He exhibits no distension.  Musculoskeletal: He exhibits no edema.       Lumbar back: He exhibits no tenderness, no bony tenderness and no swelling.  Neurological: He is alert. He has normal strength. No sensory deficit. Cranial nerve deficit: no gross deficits.  Normal strength and sensation, normal plantarflexion and dorsiflexion  Skin: Skin is warm  and dry. No rash noted.  Psychiatric: He has a normal mood and affect.  Nursing note and vitals reviewed.    ED Treatments / Results  Labs (all labs ordered are listed, but only abnormal results are displayed) Labs Reviewed - No data to display  EKG None  Radiology No results found.  Procedures Procedures (including critical care time)  Medications Ordered in ED Medications - No data to display   Initial Impression / Assessment and Plan / ED Course  I have reviewed the triage vital signs and the nursing notes.  Pertinent labs & imaging results that were available during my care of the patient were reviewed by me and considered in my medical decision making (see chart for details).    No sign of acute neurological or vascular emergency associated with pt's back pain.  Discussed supportive care.  Discussed appropriate ways to lift.  Discussed back strengthening exercises.  Follow-up with PCP or chiropractor Final Clinical Impressions(s) / ED Diagnoses   Final diagnoses:  Strain of lumbar region, initial encounter    ED Discharge Orders        Ordered    cyclobenzaprine (FLEXERIL) 10 MG tablet  2 times daily PRN     02/24/18 0722    ibuprofen (ADVIL,MOTRIN) 600 MG tablet  Every 6 hours PRN     02/24/18 [id number]       Linwood DibblesKnapp, Providencia Hottenstein, MD 02/24/18 [phone]

## 2018-02-24 NOTE — Discharge Instructions (Signed)
Take the medications as prescribed, follow up with a primary care doctor or consider seeing a chiropractor if the symptoms persist

## 2018-02-24 NOTE — ED Triage Notes (Signed)
Pt reports lower back pain going down right leg for a few weeks since starting a new job.  States it feels like it needs to "pop" and would feel better.

## 2019-05-16 ENCOUNTER — Other Ambulatory Visit: Payer: Self-pay

## 2019-05-16 ENCOUNTER — Emergency Department (HOSPITAL_COMMUNITY): Payer: Self-pay

## 2019-05-16 ENCOUNTER — Emergency Department (HOSPITAL_COMMUNITY)
Admission: EM | Admit: 2019-05-16 | Discharge: 2019-05-16 | Disposition: A | Discharge status: Home / Self Care | Payer: Self-pay | Attending: Emergency Medicine | Admitting: Emergency Medicine

## 2019-05-16 ENCOUNTER — Encounter (HOSPITAL_COMMUNITY): Payer: Self-pay | Admitting: Emergency Medicine

## 2019-05-16 DIAGNOSIS — F1210 Cannabis abuse, uncomplicated: Secondary | ICD-10-CM | POA: Insufficient documentation

## 2019-05-16 DIAGNOSIS — I10 Essential (primary) hypertension: Secondary | ICD-10-CM | POA: Insufficient documentation

## 2019-05-16 DIAGNOSIS — Y929 Unspecified place or not applicable: Secondary | ICD-10-CM | POA: Insufficient documentation

## 2019-05-16 DIAGNOSIS — W231XXA Caught, crushed, jammed, or pinched between stationary objects, initial encounter: Secondary | ICD-10-CM | POA: Insufficient documentation

## 2019-05-16 DIAGNOSIS — E119 Type 2 diabetes mellitus without complications: Secondary | ICD-10-CM | POA: Insufficient documentation

## 2019-05-16 DIAGNOSIS — Y998 Other external cause status: Secondary | ICD-10-CM | POA: Insufficient documentation

## 2019-05-16 DIAGNOSIS — J45909 Unspecified asthma, uncomplicated: Secondary | ICD-10-CM | POA: Insufficient documentation

## 2019-05-16 DIAGNOSIS — M25531 Pain in right wrist: Secondary | ICD-10-CM | POA: Insufficient documentation

## 2019-05-16 DIAGNOSIS — Y9389 Activity, other specified: Secondary | ICD-10-CM | POA: Insufficient documentation

## 2019-05-16 MED ORDER — NAPROXEN 250 MG PO TABS
500.0000 mg | ORAL_TABLET | Freq: Once | ORAL | Status: AC
  Administered 2019-05-16: 20:00:00 500 mg via ORAL
  Filled 2019-05-16: qty 2

## 2019-05-16 NOTE — Discharge Instructions (Addendum)
Your xray today was negative.  Please take some ibuprofen or Tylenol to help with your pain.

## 2019-05-16 NOTE — ED Notes (Signed)
Pt given snack. Pt using cell phone with both hands

## 2019-05-16 NOTE — ED Notes (Signed)
EDP in room  

## 2019-05-16 NOTE — ED Provider Notes (Signed)
[facility] EMERGENCY DEPARTMENT Provider Note   CSN: [id number] Arrival date & time: 05/16/19  1813     History   Chief Complaint Chief Complaint  Patient presents with  . Wrist Pain    HPI Clydell HakimZachary Schurman is a 23 y.o. male.     23 y.o male with a PMH of DM, Asthma presents to the ED status post right hand injury last night around 11 PM.  Patient reports he was changing a tire when the jack came off from under the tire and his right wrist got crushed by the car.  He reports he was unable to retractor his hand back however girlfriend was at the bedside managed to remove his hand from under the car.  He reports pain to the wrist area worse with movement along with wrist flexion.  He has not tried any medical therapy for relieving symptoms.  Upon entering the room patient is texting actively with both hands holding his cell phone.  There is no obvious deformity, lacerations, abrasions noted to the right wrist.  No other signs of trauma or injuries.  The history is provided by the patient.    Past Medical History:  Diagnosis Date  . Asthma   . Diabetes mellitus   . Environmental allergies   . Hypertension     Patient Active Problem List   Diagnosis Date Noted  . Asthma 09/11/2017  . Acute respiratory failure with hypoxemia (HCC) 09/10/2017  . Obesity, Class III, BMI 40-49.9 (morbid obesity) (HCC) 09/10/2017  . Asthma exacerbation 09/09/2017  . Hypertension 09/09/2017  . Abnormal EKG 09/09/2017  . Morbid obesity (HCC) 03/13/2012  . Prediabetes 03/13/2012  . Impaired glucose tolerance 03/13/2012  . Acanthosis 03/13/2012  . Gynecomastia 03/13/2012    Past Surgical History:  Procedure Laterality Date  . CARDIAC SURGERY    . SMALL INTESTINE SURGERY    . testicular removal          Home Medications    Prior to Admission medications   Medication Sig Start Date End Date Taking? Authorizing Provider  albuterol (PROVENTIL HFA;VENTOLIN HFA) 108 (90 Base) MCG/ACT inhaler  Inhale 2 puffs into the lungs every 4 (four) hours as needed. 09/11/17   Catarina Hartshornat, David, MD  cyclobenzaprine (FLEXERIL) 10 MG tablet Take 1 tablet (10 mg total) by mouth 2 (two) times daily as needed for muscle spasms. 02/24/18   Linwood DibblesKnapp, Jon, MD  ibuprofen (ADVIL,MOTRIN) 600 MG tablet Take 1 tablet (600 mg total) by mouth every 6 (six) hours as needed. 02/24/18   Linwood DibblesKnapp, Jon, MD  predniSONE (DELTASONE) 10 MG tablet Take 6 tablets (60 mg total) by mouth daily with breakfast. And decrease by one tablet daily 09/11/17   Catarina Hartshornat, David, MD    Family History Family History  Problem Relation Age of Onset  . Hypertension Mother   . Obesity Mother   . Cancer Maternal Grandmother   . Diabetes Maternal Grandmother   . Diabetes Maternal Grandfather   . Hypertension Maternal Grandfather   . Heart disease Maternal Grandfather   . Stroke Maternal Grandfather     Social History Social History   Tobacco Use  . Smoking status: Never Smoker  . Smokeless tobacco: Never Used  . Tobacco comment: vapor  Substance Use Topics  . Alcohol use: Yes    Comment: occ  . Drug use: Yes    Types: Marijuana     Allergies   Bee venom   Review of Systems Review of Systems  Constitutional: Negative for  fever.  Musculoskeletal: Positive for arthralgias.     Physical Exam Updated Vital Signs BP (!) 102/54 (BP Location: Left Arm)   Pulse 73   Temp 99.1 F (37.3 C) (Oral)   Resp 18   Ht 6' (1.829 m)   Wt 124.7 kg   SpO2 98%   BMI 37.30 kg/m   Physical Exam Vitals signs and nursing note reviewed.  Constitutional:      Appearance: He is well-developed. He is obese.  HENT:     Head: Normocephalic and atraumatic.  Eyes:     General: No scleral icterus.    Pupils: Pupils are equal, round, and reactive to light.  Neck:     Musculoskeletal: Normal range of motion.  Cardiovascular:     Heart sounds: Normal heart sounds.  Pulmonary:     Effort: Pulmonary effort is normal.     Breath sounds: Normal breath  sounds. No wheezing.  Chest:     Chest wall: No tenderness.  Abdominal:     General: Bowel sounds are normal. There is no distension.     Palpations: Abdomen is soft.     Tenderness: There is no abdominal tenderness.  Musculoskeletal:        General: No deformity.     Right wrist: He exhibits tenderness. He exhibits normal range of motion, no swelling, no effusion, no crepitus, no deformity and no laceration.       Arms:     Comments: Full range of motion without pain.  Skin:    General: Skin is warm and dry.  Neurological:     Mental Status: He is alert and oriented to person, place, and time.      ED Treatments / Results  Labs (all labs ordered are listed, but only abnormal results are displayed) Labs Reviewed - No data to display  EKG None  Radiology Dg Wrist Complete Right  Result Date: 05/16/2019 CLINICAL DATA:  Fall, wrist pain EXAM: RIGHT WRIST - COMPLETE 3+ VIEW COMPARISON:  None. FINDINGS: There is no evidence of fracture or dislocation. There is no evidence of arthropathy or other focal bone abnormality. Soft tissues are unremarkable. IMPRESSION: Negative. Electronically Signed   By: Rolm Baptise M.D.   On: 05/16/2019 20:15    Procedures Procedures (including critical care time)  Medications Ordered in ED Medications  naproxen (NAPROSYN) tablet 500 mg (500 mg Oral Given 05/16/19 1949)     Initial Impression / Assessment and Plan / ED Course  I have reviewed the triage vital signs and the nursing notes.  Pertinent labs & imaging results that were available during my care of the patient were reviewed by me and considered in my medical decision making (see chart for details).       Patient with a past medical history of asthma presents to the ED status post right hand injury, reports changing a tire last night when the car fell onto his hand, likely crush injury, girlfriend removed hand from under the car.  Patient has not tried any medication for relieving  symptoms.  Upon entering room patient is texting actively with both hands, is using right hand without any difficulty without any decrease in range of motion.  During evaluation there is no obvious deformity, laceration, abrasion present.  He does report of pain mostly along needle aspect of the wrist, worse with hand flexion rather than extension.  Neuro exam is otherwise unremarkable.  Capillary refill is intact.  Will obtain x-ray to further evaluate his injury,  given naproxen to help with his pain.  X-ray of the right wrist was within normal limits without any acute dislocation or fracture.  Patient will be placed on a right Velcro wrist splint, will also have him follow-up with PCP in order to obtain a second x-ray to rule out any occult fractures.  Patient understands and agrees with management.  Return precautions provided at length.    Portions of this note were generated with Scientist, clinical (histocompatibility and immunogenetics). Dictation errors may occur despite best attempts at proofreading.  Final Clinical Impressions(s) / ED Diagnoses   Final diagnoses:  Right wrist pain    ED Discharge Orders    None       Claude Manges, Cordelia Poche 05/16/19 2024    Samuel Jester, DO 05/21/19 1113

## 2019-05-16 NOTE — ED Triage Notes (Signed)
Car fell on his RT wrist last night after changing a tire.  Pain to that area.  Pt can move wrist. Strong radial pulse palpated.

## 2019-05-16 NOTE — ED Notes (Signed)
XR out of room.  

## 2019-05-26 ENCOUNTER — Other Ambulatory Visit: Payer: Self-pay

## 2019-05-26 ENCOUNTER — Emergency Department (HOSPITAL_COMMUNITY): Payer: Self-pay

## 2019-05-26 ENCOUNTER — Emergency Department (HOSPITAL_COMMUNITY)
Admission: EM | Admit: 2019-05-26 | Discharge: 2019-05-26 | Disposition: A | Discharge status: Home / Self Care | Payer: Self-pay | Attending: Emergency Medicine | Admitting: Emergency Medicine

## 2019-05-26 ENCOUNTER — Encounter (HOSPITAL_COMMUNITY): Payer: Self-pay

## 2019-05-26 DIAGNOSIS — S638X1D Sprain of other part of right wrist and hand, subsequent encounter: Secondary | ICD-10-CM | POA: Insufficient documentation

## 2019-05-26 DIAGNOSIS — E119 Type 2 diabetes mellitus without complications: Secondary | ICD-10-CM | POA: Insufficient documentation

## 2019-05-26 DIAGNOSIS — S63501D Unspecified sprain of right wrist, subsequent encounter: Secondary | ICD-10-CM

## 2019-05-26 DIAGNOSIS — I10 Essential (primary) hypertension: Secondary | ICD-10-CM | POA: Insufficient documentation

## 2019-05-26 DIAGNOSIS — W208XXD Other cause of strike by thrown, projected or falling object, subsequent encounter: Secondary | ICD-10-CM | POA: Insufficient documentation

## 2019-05-26 DIAGNOSIS — J45909 Unspecified asthma, uncomplicated: Secondary | ICD-10-CM | POA: Insufficient documentation

## 2019-05-26 NOTE — ED Provider Notes (Signed)
[facility] EMERGENCY DEPARTMENT Provider Note   CSN: [id number] Arrival date & time: 05/26/19  2030     History   Chief Complaint Chief Complaint  Patient presents with  . Wrist Pain    HPI Steve Livingston is a 23 y.o. male.     The history is provided by the patient and medical records. No language interpreter was used.  Wrist Pain     23 year old male presenting for evaluation of right wrist injury.  Approximately 10 days ago, patient injured his right wrist when it was crushed by a car that he was in the process of changing the tire.  States that the jack came off from 1 of the time and his wrist got crushed by the car.  He was seen in the ED for his complaint, and initial x-ray of the right wrist show no acute finding.  Rice therapy was discussed and patient was given a Velcro wrist splint as well as anti-inflammatory medication.  He mentioned he has been taking the medication.  He was told to follow-up with his PCP for a repeat x-ray but unable to obtain one at this time.  This morning, he was playing kickball with his goddaughter when the ball accidentally struck his right wrist causing pain.  He described pain as a sharp, nonradiating 7 out of 10 pain to the dorsum of the wrist.  He denies any other injury no elbow pain no hand pain no numbness.  He is right-hand dominant.  Past Medical History:  Diagnosis Date  . Asthma   . Diabetes mellitus   . Environmental allergies   . Hypertension     Patient Active Problem List   Diagnosis Date Noted  . Asthma 09/11/2017  . Acute respiratory failure with hypoxemia (HCC) 09/10/2017  . Obesity, Class III, BMI 40-49.9 (morbid obesity) (HCC) 09/10/2017  . Asthma exacerbation 09/09/2017  . Hypertension 09/09/2017  . Abnormal EKG 09/09/2017  . Morbid obesity (HCC) 03/13/2012  . Prediabetes 03/13/2012  . Impaired glucose tolerance 03/13/2012  . Acanthosis 03/13/2012  . Gynecomastia 03/13/2012    Past Surgical History:   Procedure Laterality Date  . CARDIAC SURGERY    . SMALL INTESTINE SURGERY    . testicular removal          Home Medications    Prior to Admission medications   Medication Sig Start Date End Date Taking? Authorizing Provider  albuterol (PROVENTIL HFA;VENTOLIN HFA) 108 (90 Base) MCG/ACT inhaler Inhale 2 puffs into the lungs every 4 (four) hours as needed. 09/11/17   Catarina Hartshorn, MD  cyclobenzaprine (FLEXERIL) 10 MG tablet Take 1 tablet (10 mg total) by mouth 2 (two) times daily as needed for muscle spasms. 02/24/18   Linwood Dibbles, MD  ibuprofen (ADVIL,MOTRIN) 600 MG tablet Take 1 tablet (600 mg total) by mouth every 6 (six) hours as needed. 02/24/18   Linwood Dibbles, MD  predniSONE (DELTASONE) 10 MG tablet Take 6 tablets (60 mg total) by mouth daily with breakfast. And decrease by one tablet daily 09/11/17   Catarina Hartshorn, MD    Family History Family History  Problem Relation Age of Onset  . Hypertension Mother   . Obesity Mother   . Cancer Maternal Grandmother   . Diabetes Maternal Grandmother   . Diabetes Maternal Grandfather   . Hypertension Maternal Grandfather   . Heart disease Maternal Grandfather   . Stroke Maternal Grandfather     Social History Social History   Tobacco Use  . Smoking status: Never  Smoker  . Smokeless tobacco: Never Used  . Tobacco comment: vapor  Substance Use Topics  . Alcohol use: Yes    Comment: occ  . Drug use: Yes    Types: Marijuana     Allergies   Bee venom   Review of Systems Review of Systems  Constitutional: Negative for fever.  Musculoskeletal: Positive for arthralgias.  Neurological: Negative for numbness.     Physical Exam Updated Vital Signs BP 133/80 (BP Location: Left Arm)   Pulse 85   Temp 98.8 F (37.1 C) (Oral)   Resp 18   Ht 6' (1.829 m)   Wt 124.7 kg   SpO2 98%   BMI 37.30 kg/m   Physical Exam Vitals signs and nursing note reviewed.  Constitutional:      General: He is not in acute distress.    Appearance: He is  well-developed.  HENT:     Head: Atraumatic.  Eyes:     Conjunctiva/sclera: Conjunctivae normal.  Neck:     Musculoskeletal: Neck supple.  Musculoskeletal:        General: Tenderness (Right wrist: Minimal tenderness noted to dorsum of wrist with normal wrist flexion extension supination and pronation.  Radial pulse 2+.  Normal grip strength.  Right forearm soft nontender.) present.  Skin:    Findings: No rash.  Neurological:     Mental Status: He is alert.      ED Treatments / Results  Labs (all labs ordered are listed, but only abnormal results are displayed) Labs Reviewed - No data to display  EKG None  Radiology Dg Wrist Complete Right  Result Date: 05/26/2019 CLINICAL DATA:  Continued posterior right wrist pain since an injury 2 weeks ago. EXAM: RIGHT WRIST - COMPLETE 3+ VIEW COMPARISON:  05/16/2019 FINDINGS: There is no evidence of fracture or dislocation. There is no evidence of arthropathy or other focal bone abnormality. Soft tissues are unremarkable. IMPRESSION: Normal examination. Electronically Signed   By: Claudie Revering M.D.   On: 05/26/2019 21:12    Procedures Procedures (including critical care time)  Medications Ordered in ED Medications - No data to display   Initial Impression / Assessment and Plan / ED Course  I have reviewed the triage vital signs and the nursing notes.  Pertinent labs & imaging results that were available during my care of the patient were reviewed by me and considered in my medical decision making (see chart for details).        BP 133/80 (BP Location: Left Arm)   Pulse 85   Temp 98.8 F (37.1 C) (Oral)   Resp 18   Ht 6' (1.829 m)   Wt 124.7 kg   SpO2 98%   BMI 37.30 kg/m    Final Clinical Impressions(s) / ED Diagnoses   Final diagnoses:  Sprain of right wrist, subsequent encounter    ED Discharge Orders    None     9:16 PM Patient here requesting for repeat x-ray of his right wrist after injuring it a week ago  with a negative wrist x-ray.  He also report reinjuring it when playing kickball with his goddaughter.  On exam patient has full range of motion about the wrist without any signs of injury and minimal tenderness.  He is neurovascularly intact.  Repeat x-ray show no acute finding.  Encouraged to continue with rice therapy and wear wrist brace for support.   Domenic Moras, PA-C 05/26/19 2131    Daleen Bo, MD 05/27/19 1005

## 2019-05-26 NOTE — Discharge Instructions (Signed)
Your repeat xray of the right wrist is normal.  Continue to wear wrist splint as needed for protection.  Take tylenol or ibuprofen as needed for pain.

## 2019-05-26 NOTE — ED Triage Notes (Signed)
Pt here recently for wrist pain. Said that he was supposed to get second xray but md office kept rescheduling him.  Said a kickball hit it and its been hurting again.

## 2019-05-29 ENCOUNTER — Ambulatory Visit: Payer: Self-pay | Admitting: Orthopedic Surgery

## 2019-05-29 ENCOUNTER — Encounter: Payer: Self-pay | Admitting: Orthopedic Surgery

## 2020-07-05 ENCOUNTER — Emergency Department (HOSPITAL_COMMUNITY): Payer: Self-pay

## 2020-07-05 ENCOUNTER — Other Ambulatory Visit: Payer: Self-pay

## 2020-07-05 ENCOUNTER — Encounter (HOSPITAL_COMMUNITY): Payer: Self-pay

## 2020-07-05 ENCOUNTER — Emergency Department (HOSPITAL_COMMUNITY)
Admission: EM | Admit: 2020-07-05 | Discharge: 2020-07-05 | Disposition: A | Discharge status: Home / Self Care | Payer: Self-pay | Attending: Emergency Medicine | Admitting: Emergency Medicine

## 2020-07-05 DIAGNOSIS — M79671 Pain in right foot: Secondary | ICD-10-CM | POA: Insufficient documentation

## 2020-07-05 DIAGNOSIS — J45901 Unspecified asthma with (acute) exacerbation: Secondary | ICD-10-CM | POA: Insufficient documentation

## 2020-07-05 DIAGNOSIS — E119 Type 2 diabetes mellitus without complications: Secondary | ICD-10-CM | POA: Insufficient documentation

## 2020-07-05 DIAGNOSIS — I10 Essential (primary) hypertension: Secondary | ICD-10-CM | POA: Insufficient documentation

## 2020-07-05 MED ORDER — ALBUTEROL SULFATE HFA 108 (90 BASE) MCG/ACT IN AERS
1.0000 | INHALATION_SPRAY | Freq: Once | RESPIRATORY_TRACT | Status: AC
  Administered 2020-07-05: 2 via RESPIRATORY_TRACT
  Filled 2020-07-05: qty 6.7

## 2020-07-05 NOTE — ED Provider Notes (Signed)
[facility] EMERGENCY DEPARTMENT Provider Note   CSN: [id number] Arrival date & time: 07/05/20  1537     History Chief Complaint  Patient presents with  . Foot Pain    Steve Livingston is a 24 y.o. male with past medical history significant for asthma, diabetes, environmental allergies, hypertension.  HPI Presents emergency room today with chief complaint of right foot pain x2 weeks ago.  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  He was at work and alcohol had been spilled on the floor causing him to slip.  He states he stumbled however was able to catch himself before falling.  He is unsure if he inverted or everted his ankle.  He did not have sudden onset of pain however the next morning states when he woke up he had pain in his right heel.  He has had swelling to the heel as well that resolves with elevation.  He is describing the pain as an aching and throbbing sensation.  Pain does not radiate.  Pain is worse with ambulation.  He rates pain currently 5 out of 10 in severity.  He has not been taking any over-the-counter medications for symptoms.    Past Medical History:  Diagnosis Date  . Asthma   . Diabetes mellitus   . Environmental allergies   . Hypertension     Patient Active Problem List   Diagnosis Date Noted  . Asthma 09/11/2017  . Acute respiratory failure with hypoxemia (HCC) 09/10/2017  . Obesity, Class III, BMI 40-49.9 (morbid obesity) (HCC) 09/10/2017  . Asthma exacerbation 09/09/2017  . Hypertension 09/09/2017  . Abnormal EKG 09/09/2017  . Morbid obesity (HCC) 03/13/2012  . Prediabetes 03/13/2012  . Impaired glucose tolerance 03/13/2012  . Acanthosis 03/13/2012  . Gynecomastia 03/13/2012    Past Surgical History:  Procedure Laterality Date  . CARDIAC SURGERY    . SMALL INTESTINE SURGERY    . testicular removal         Family History  Problem Relation Age of Onset  . Hypertension Mother   . Obesity Mother   . Cancer Maternal  Grandmother   . Diabetes Maternal Grandmother   . Diabetes Maternal Grandfather   . Hypertension Maternal Grandfather   . Heart disease Maternal Grandfather   . Stroke Maternal Grandfather     Social History   Tobacco Use  . Smoking status: Never Smoker  . Smokeless tobacco: Never Used  . Tobacco comment: vapor  Vaping Use  . Vaping Use: Every day  Substance Use Topics  . Alcohol use: Yes    Comment: occ  . Drug use: Yes    Types: Marijuana    Home Medications Prior to Admission medications   Medication Sig Start Date End Date Taking? Authorizing Provider  albuterol (PROVENTIL HFA;VENTOLIN HFA) 108 (90 Base) MCG/ACT inhaler Inhale 2 puffs into the lungs every 4 (four) hours as needed. 09/11/17   Catarina Hartshorn, MD  cyclobenzaprine (FLEXERIL) 10 MG tablet Take 1 tablet (10 mg total) by mouth 2 (two) times daily as needed for muscle spasms. 02/24/18   Linwood Dibbles, MD  ibuprofen (ADVIL,MOTRIN) 600 MG tablet Take 1 tablet (600 mg total) by mouth every 6 (six) hours as needed. 02/24/18   Linwood Dibbles, MD  predniSONE (DELTASONE) 10 MG tablet Take 6 tablets (60 mg total) by mouth daily with breakfast. And decrease by one tablet daily 09/11/17   Catarina Hartshorn, MD    Allergies    Bee venom  Review  of Systems   Review of Systems All other systems are reviewed and are negative for acute change except as noted in the HPI.  Physical Exam Updated Vital Signs BP (!) 155/83 (BP Location: Right Arm)   Pulse 83   Temp 98.3 F (36.8 C) (Oral)   Resp (!) 22   Ht 6' (1.829 m)   Wt 120.2 kg   SpO2 98%   BMI 35.94 kg/m   Physical Exam Vitals and nursing note reviewed.  Constitutional:      Appearance: He is well-developed. He is not ill-appearing or toxic-appearing.  HENT:     Head: Normocephalic and atraumatic.     Nose: Nose normal.  Eyes:     General: No scleral icterus.       Right eye: No discharge.        Left eye: No discharge.     Conjunctiva/sclera: Conjunctivae normal.  Neck:      Vascular: No JVD.  Cardiovascular:     Rate and Rhythm: Normal rate and regular rhythm.     Pulses: Normal pulses.     Heart sounds: Normal heart sounds.  Pulmonary:     Effort: Pulmonary effort is normal.     Breath sounds: Normal breath sounds.  Abdominal:     General: There is no distension.  Musculoskeletal:        General: Normal range of motion.     Cervical back: Normal range of motion.     Right hip: Normal.     Right knee: Normal.       Feet:     Comments: Ambulatory with normal gait.  Right lower extremity is neurovascularly intact.   Feet:     Comments: Tender to palpation as depicted in image above. Soft tissue swelling noted to forefoot. No open wounds. DP pulse 2+ bilaterally. No tenderness to palpation of forefoot.  Ankle joint intact. No tenderness to palpation of medial or lateral malleolus. No obvious deformity of right foot or ankle. Brisk cap refill. Wiggles toes without difficulty. Sensation intact. Skin:    General: Skin is warm and dry.  Neurological:     Mental Status: He is oriented to person, place, and time.     GCS: GCS eye subscore is 4. GCS verbal subscore is 5. GCS motor subscore is 6.     Comments: Fluent speech, no facial droop.  Psychiatric:        Behavior: Behavior normal.     ED Results / Procedures / Treatments   Labs (all labs ordered are listed, but only abnormal results are displayed) Labs Reviewed - No data to display  EKG None  Radiology DG Foot Complete Right  Result Date: 07/05/2020 CLINICAL DATA:  Right foot pain for 2 weeks, fell EXAM: RIGHT FOOT COMPLETE - 3+ VIEW COMPARISON:  None. FINDINGS: Frontal, oblique, and lateral views of the right foot are obtained. No acute fracture, subluxation, or dislocation. Joint spaces are well preserved. Mild diffuse soft tissue swelling. IMPRESSION: 1. Soft tissue edema, no acute fracture. Electronically Signed   By: Sharlet Salina M.D.   On: 07/05/2020 17:45    Procedures Procedures  (including critical care time)  Medications Ordered in ED Medications  albuterol (VENTOLIN HFA) 108 (90 Base) MCG/ACT inhaler 1-2 puff (2 puffs Inhalation Given 07/05/20 1818)    ED Course  I have reviewed the triage vital signs and the nursing notes.  Pertinent labs & imaging results that were available during my care of the patient were  reviewed by me and considered in my medical decision making (see chart for details).    MDM Rules/Calculators/A&P                          History provided by patient with additional history obtained from chart review.    Patient presents to the ED with complaints of pain to the right heel s/p injury slipping on spilled alcohol on the floor at work x 2 weeks ago.. Exam without obvious deformity or open wounds. ROM intact. Tender to palpation of right heel. NVI distally. Xray viewed by me negative for fracture/dislocation. Therapeutic splint provided. PRICE and motrin recommended. I discussed results, treatment plan, need for follow-up, and return precautions with the patient. Provided opportunity for questions, patient confirmed understanding and are in agreement with plan. Patient given albuterol inhaler at his request because he does not have one at home and has no refills. No respiratory distress noted.   Portions of this note were generated with Scientist, clinical (histocompatibility and immunogenetics). Dictation errors may occur despite best attempts at proofreading.   Final Clinical Impression(s) / ED Diagnoses Final diagnoses:  Foot pain, right    Rx / DC Orders ED Discharge Orders    None       Kandice Hams 07/05/20 Tora Perches, MD 07/09/20 1415

## 2020-07-05 NOTE — ED Triage Notes (Signed)
Pt to er, pt states that he was at work and he slipped on a bottle and hurt his R foot.  States that it hurts to walk.  States that he hurt his foot about two weeks ago

## 2020-07-05 NOTE — Discharge Instructions (Addendum)
The x-ray today did not show any broken bones in your foot or ankle.  It is likely that your pain is caused by a sprain.  This can still take 4 to 6 weeks to feel better.  You can wear the boot for comfort.  If you feel like you need to use the crutches to put less weight on your foot you can do that as well.  If you notice any swelling in your ankle and foot you should elevate it.  You can apply ice to help with pain.  You should take Tylenol and ibuprofen over the next several days to help with pain and swelling as well.  If you continue to have pain you should follow-up with your primary care doctor.  Return to the emergency department if you have any new or worsening symptoms.

## 2021-04-10 ENCOUNTER — Emergency Department (HOSPITAL_COMMUNITY): Payer: Self-pay

## 2021-04-10 ENCOUNTER — Observation Stay (HOSPITAL_COMMUNITY)
Admission: EM | Admit: 2021-04-10 | Discharge: 2021-04-10 | Disposition: A | Discharge status: Home / Self Care | Payer: Self-pay | Attending: Family Medicine | Admitting: Family Medicine

## 2021-04-10 ENCOUNTER — Other Ambulatory Visit: Payer: Self-pay

## 2021-04-10 ENCOUNTER — Encounter (HOSPITAL_COMMUNITY): Payer: Self-pay

## 2021-04-10 DIAGNOSIS — J4521 Mild intermittent asthma with (acute) exacerbation: Secondary | ICD-10-CM

## 2021-04-10 DIAGNOSIS — J4541 Moderate persistent asthma with (acute) exacerbation: Principal | ICD-10-CM | POA: Insufficient documentation

## 2021-04-10 DIAGNOSIS — E119 Type 2 diabetes mellitus without complications: Secondary | ICD-10-CM | POA: Insufficient documentation

## 2021-04-10 DIAGNOSIS — E66813 Obesity, class 3: Secondary | ICD-10-CM | POA: Diagnosis present

## 2021-04-10 DIAGNOSIS — Z79899 Other long term (current) drug therapy: Secondary | ICD-10-CM | POA: Insufficient documentation

## 2021-04-10 DIAGNOSIS — N62 Hypertrophy of breast: Secondary | ICD-10-CM

## 2021-04-10 DIAGNOSIS — J45901 Unspecified asthma with (acute) exacerbation: Secondary | ICD-10-CM | POA: Diagnosis present

## 2021-04-10 DIAGNOSIS — U071 COVID-19: Secondary | ICD-10-CM | POA: Diagnosis present

## 2021-04-10 DIAGNOSIS — R7302 Impaired glucose tolerance (oral): Secondary | ICD-10-CM

## 2021-04-10 DIAGNOSIS — I10 Essential (primary) hypertension: Secondary | ICD-10-CM | POA: Insufficient documentation

## 2021-04-10 LAB — RESP PANEL BY RT-PCR (FLU A&B, COVID) ARPGX2
Influenza A by PCR: NEGATIVE
Influenza B by PCR: NEGATIVE
SARS Coronavirus 2 by RT PCR: POSITIVE — AB

## 2021-04-10 MED ORDER — IPRATROPIUM-ALBUTEROL 0.5-2.5 (3) MG/3ML IN SOLN
3.0000 mL | Freq: Once | RESPIRATORY_TRACT | Status: AC
  Administered 2021-04-10: 3 mL via RESPIRATORY_TRACT
  Filled 2021-04-10: qty 3

## 2021-04-10 MED ORDER — NIRMATRELVIR/RITONAVIR (PAXLOVID)TABLET: 3.0000 | ORAL_TABLET | Freq: Two times a day (BID) | ORAL | 0 refills | Status: AC

## 2021-04-10 MED ORDER — METHYLPREDNISOLONE SODIUM SUCC 40 MG IJ SOLR: 40.0000 mg | Freq: Two times a day (BID) | INTRAMUSCULAR | Status: DC

## 2021-04-10 MED ORDER — METHYLPREDNISOLONE SODIUM SUCC 125 MG IJ SOLR
125.0000 mg | Freq: Once | INTRAMUSCULAR | Status: AC
  Administered 2021-04-10: 125 mg via INTRAVENOUS
  Filled 2021-04-10: qty 2

## 2021-04-10 MED ORDER — PREDNISONE 20 MG PO TABS: 40.0000 mg | ORAL_TABLET | Freq: Every day | ORAL | 0 refills | Status: AC

## 2021-04-10 MED ORDER — ALBUTEROL SULFATE HFA 108 (90 BASE) MCG/ACT IN AERS: 2.0000 | INHALATION_SPRAY | RESPIRATORY_TRACT | 5 refills | Status: DC | PRN

## 2021-04-10 MED ORDER — BUDESONIDE-FORMOTEROL FUMARATE 160-4.5 MCG/ACT IN AERO: 2.0000 | INHALATION_SPRAY | Freq: Two times a day (BID) | RESPIRATORY_TRACT | 5 refills | Status: DC

## 2021-04-10 MED ORDER — MAGNESIUM SULFATE IN D5W 1-5 GM/100ML-% IV SOLN
1.0000 g | Freq: Once | INTRAVENOUS | Status: AC
  Administered 2021-04-10: 1 g via INTRAVENOUS
  Filled 2021-04-10: qty 100

## 2021-04-10 MED ORDER — PREDNISONE 50 MG PO TABS
60.0000 mg | ORAL_TABLET | Freq: Once | ORAL | Status: AC
  Administered 2021-04-10: 60 mg via ORAL
  Filled 2021-04-10: qty 1

## 2021-04-10 NOTE — Discharge Instructions (Signed)
1)Avoid Smoke Exposure due to your asthma 2)Follow up with primary care physician for reevaluation within a week 3)Please take Symbicort (budesonide-formoterol)  inhaler twice daily as prescribed regardless of whether you are sick or not 4)Please use albuterol/ProAir inhaler--as prescribed as needed for shortness of breath or cough or wheezing 5)Morbid Obesity- -Body mass index is 42.72 kg/m-- -Low calorie diet, portion control and increase physical activity advised to help with weight loss and help reduce risk of diabetes, risk of hypertension and risk of Death

## 2021-04-10 NOTE — Consult Note (Signed)
Patient Demographics:    Steve Livingston, is a 25 y.o. male  MRN: [id number]   DOB - Mar 26, 1996  Admit Date - 04/10/2021  Outpatient Primary MD for the patient is Patient, No Pcp Per (Inactive)   Assessment & Plan:    Principal Problem:   Asthma exacerbation Active Problems:   Impaired glucose tolerance   Gynecomastia   Obesity, Class III, BMI 40-49.9 (morbid obesity) (HCC)    1)Acute Asthma  Exacerbation- no definite pneumonia (suspect atelectasis),  treated empirically with IV Solu-Medrol and bronchodilators  -Post ambulation O2 sats 94 to 95% on room air -Post ambulation exam reveals no further wheezing -Patient speaking in complete sentences after steroids and bronchodilators and iv magnesium -Prior to arrival patient was only using as needed short acting rescue bronchodilators and was not on control asthma medications -Patient apparently ran out of his rescue medications for his asthma symptoms worsen hence he came to the ED -Patient strongly advised to start Symbicort twice daily for mild persistent asthma -May use albuterol rescue inhaler as needed -P.o. prednisone as prescribed -No productive cough, no fevers--clinically and radiologically pneumonia less likely -Antibiotics not indicated -  2)Morbid Obesity- -Low calorie diet, portion control and increase physical activity discussed with patient -Body mass index is 42.72 kg/m.  3) history of glucose intolerance and elevated BP--- lifestyle changes as noted in #2  Disposition-discharge home please see discharge instructions   With History of - Reviewed by me  Past Medical History:  Diagnosis Date   Asthma    Diabetes mellitus    Environmental allergies    Hypertension       Past Surgical History:  Procedure Laterality Date   CARDIAC  SURGERY     SMALL INTESTINE SURGERY     testicular removal      Chief Complaint  Patient presents with   Shortness of Breath      HPI:    Steve Livingston  is a 25 y.o. male with past medical history relevant for morbid obesity, history of glucose intolerance and elevated BP as well as mild persistent asthma--- who presents to the ED with worsening cough, wheezing shortness of breath- -- Initially O2 sats in the ED was down to 90% on room air -After interventions in the ED including steroids, IV magnesium and bronchodilators patient was ambulated in the ED and --Post ambulation O2 sats 94 to 95% on room air -Post ambulation exam reveals no further wheezing -Patient speaking in complete sentences after steroids and bronchodilators and iv magnesium -Prior to arrival patient was only using as needed short acting rescue bronchodilators and was not on control asthma medications -Patient apparently ran out of his rescue medications for his asthma symptoms worsen hence he came to the ED No fever  Or chills  -No productive cough, no fevers--clinically and radiologically pneumonia less likely -Antibiotics not indicated    Review of systems:    In addition to the HPI above,  A full Review of  Systems was done, all other systems reviewed are negative except as noted above in HPI , .    Social History:  Reviewed by me    Social History   Tobacco Use   Smoking status: Never   Smokeless tobacco: Never   Tobacco comments:    vapor  Substance Use Topics   Alcohol use: Yes    Comment: occ     Family History :  Reviewed by me    Family History  Problem Relation Age of Onset   Hypertension Mother    Obesity Mother    Cancer Maternal Grandmother    Diabetes Maternal Grandmother    Diabetes Maternal Grandfather    Hypertension Maternal Grandfather    Heart disease Maternal Grandfather    Stroke Maternal Grandfather      Home Medications:   Prior to Admission medications    Medication Sig Start Date End Date Taking? Authorizing Provider  budesonide-formoterol (SYMBICORT) 160-4.5 MCG/ACT inhaler Inhale 2 puffs into the lungs 2 (two) times daily. 04/10/21  Yes Adhvik Canady, MD  predniSONE (DELTASONE) 20 MG tablet Take 2 tablets (40 mg total) by mouth daily with breakfast for 5 days. 04/10/21 04/15/21 Yes Shon Hale, MD  albuterol (VENTOLIN HFA) 108 (90 Base) MCG/ACT inhaler Inhale 2 puffs into the lungs every 4 (four) hours as needed for wheezing or shortness of breath. 04/10/21   Mariea Clonts, Numan Zylstra, MD  cyclobenzaprine (FLEXERIL) 10 MG tablet Take 1 tablet (10 mg total) by mouth 2 (two) times daily as needed for muscle spasms. 02/24/18   Linwood Dibbles, MD     Allergies:     Allergies  Allergen Reactions   Bee Venom Swelling     Physical Exam:   Vitals  Blood pressure (!) 142/90, pulse 95, temperature 97.9 F (36.6 C), resp. rate (!) 34, height 6' (1.829 m), weight (!) 142.9 kg, SpO2 94 %.  Temp:  [97.9 F (36.6 C)-98.5 F (36.9 C)] 97.9 F (36.6 C) (08/20 1631) Pulse Rate:  [84-107] 95 (08/20 1600) Cardiac Rhythm: Normal sinus rhythm (08/20 1547) Resp:  [21-37] 34 (08/20 1600) BP: (112-145)/(72-111) 142/90 (08/20 1530) SpO2:  [91 %-99 %] 94 % (08/20 1600) Weight:  [142.9 kg] 142.9 kg (08/20 1224)  Physical Examination: General appearance - alert, obese appearing, and in no distress --- speaking in complete sentences Mental status - alert, oriented to person, place, and time,  Eyes - sclera anicteric Neck - supple, no JVD elevation , Chest - clear  to auscultation bilaterally, symmetrical air movement,  Heart - S1 and S2 normal, regular  Abdomen - soft, nontender, nondistended, no masses or organomegaly Neurological - screening mental status exam normal, neck supple without rigidity, cranial nerves II through XII intact, DTR's normal and symmetric Extremities - no pedal edema noted, intact peripheral pulses  Skin - warm, dry    Data Review:     CBC No results for input(s): WBC, HGB, HCT, PLT, MCV, MCH, MCHC, RDW, LYMPHSABS, MONOABS, EOSABS, BASOSABS, BANDABS in the last 168 hours.  Invalid input(s): NEUTRABS, BANDSABD ------------------------------------------------------------------------------------------------------------------  Chemistries  No results for input(s): NA, K, CL, CO2, GLUCOSE, BUN, CREATININE, CALCIUM, MG, AST, ALT, ALKPHOS, BILITOT in the last 168 hours.  Invalid input(s): GFRCGP ------------------------------------------------------------------------------------------------------------------ CrCl cannot be calculated (Patient's most recent lab result is older than the maximum 21 days allowed.). ------------------------------------------------------------------------------------------------------------------ No results for input(s): TSH, T4TOTAL, T3FREE, THYROIDAB in the last 72 hours.  Invalid input(s): FREET3   Coagulation profile No results for  input(s): INR, PROTIME in the last 168 hours. ------------------------------------------------------------------------------------------------------------------- No results for input(s): DDIMER in the last 72 hours. -------------------------------------------------------------------------------------------------------------------  Cardiac Enzymes No results for input(s): CKMB, TROPONINI, MYOGLOBIN in the last 168 hours.  Invalid input(s): CK ------------------------------------------------------------------------------------------------------------------ No results found for: BNP   ---------------------------------------------------------------------------------------------------------------  Urinalysis No results found for: COLORURINE, APPEARANCEUR, LABSPEC, PHURINE, GLUCOSEU, HGBUR, BILIRUBINUR, KETONESUR, PROTEINUR, UROBILINOGEN, NITRITE,  LEUKOCYTESUR  ----------------------------------------------------------------------------------------------------------------   Imaging Results:    DG Chest Portable 1 View  Result Date: 04/10/2021 CLINICAL DATA:  Mia, shortness of breath. EXAM: PORTABLE CHEST 1 VIEW COMPARISON:  September 09, 2017. FINDINGS: EKG leads project over the chest. Trachea is midline. Cardiomediastinal contours and hilar structures are normal. Vague opacity at the LEFT lung base just above the LEFT hemidiaphragm with linear features. No signs of pleural effusion on frontal view. No visible pneumothorax. On limited assessment there is no acute skeletal process. IMPRESSION: Vague opacity at the LEFT lung base just above the LEFT hemidiaphragm with linear features, more likely atelectasis, difficult to exclude developing infection. Electronically Signed   By: Donzetta Kohut M.D.   On: 04/10/2021 14:05    Radiological Exams on Admission: DG Chest Portable 1 View  Result Date: 04/10/2021 CLINICAL DATA:  Mia, shortness of breath. EXAM: PORTABLE CHEST 1 VIEW COMPARISON:  September 09, 2017. FINDINGS: EKG leads project over the chest. Trachea is midline. Cardiomediastinal contours and hilar structures are normal. Vague opacity at the LEFT lung base just above the LEFT hemidiaphragm with linear features. No signs of pleural effusion on frontal view. No visible pneumothorax. On limited assessment there is no acute skeletal process. IMPRESSION: Vague opacity at the LEFT lung base just above the LEFT hemidiaphragm with linear features, more likely atelectasis, difficult to exclude developing infection. Electronically Signed   By: Donzetta Kohut M.D.   On: 04/10/2021 14:05    Family Communication:  patients condition and plan of care including tests being ordered have been discussed with the patient  who indicate understanding and agree with the plan   Code Status - Full Code  DC to home   Condition   stable w/o Hypoxia  Shon Hale M.D on 04/10/2021 at 4:34 PM Go to [email or web address] -  for contact info  [facility] - Office  [phone]

## 2021-04-10 NOTE — Progress Notes (Signed)
Multiple attempts by myself and ED staff to reach patient to notify him of COVID-positive status and possibility of getting Paxlovid- - Multiple phone calls to patient's cell phone at [phone]--- it goes straight to voicemail but voicemail is full--so unable to leave message -Patient cell phone had died when he was in the ED here he did not have a charger with him  -Attempted multiple times unsuccessfully to reach patient's mother at [phone]--  -Over the last few hours several attempts have been made to contact patient - Shon Hale, MD

## 2021-04-10 NOTE — ED Notes (Signed)
Per md he has continued to try and call pt to notify him without answer.  Per md he will continue to try and notify pt.

## 2021-04-10 NOTE — ED Notes (Addendum)
Date and time results received: 04/10/21 1727  Test: Covid Critical Value: Positive  Name of Provider Notified: Courage, Emokpae  Md notified of result, pt has been d/c, per md he has now tried to call pt's cell phone and mom's cell phone to update him that he is positive and that a script for paxlovid has been called into [facility] pharmacy in [place].

## 2021-04-10 NOTE — ED Notes (Signed)
Discharge instructions, including prescription, discussed with pt. Pt verbalized understanding with no questions at this time.

## 2021-04-10 NOTE — ED Triage Notes (Signed)
Pt. States they have been using their inhaler since two days ago for [facility]. Pt. State they ran out of their inhaler this morning.

## 2021-04-10 NOTE — Progress Notes (Signed)
  After being treated and discharged from the ED for presumed asthma exacerbation -pt's COVID-19 test came back positive  -still trying to reach patient to let him know that prescription for Paxlovid has been sent to the pharmacy for him and also to let him know to isolate for up to 10 days  Shon Hale, MD

## 2021-04-10 NOTE — ED Provider Notes (Signed)
[facility] EMERGENCY DEPARTMENT Provider Note   CSN: [id number] Arrival date & time: 04/10/21  1205     History Chief Complaint  Patient presents with   Shortness of Breath    Tyree Fluharty is a 25 y.o. male with PMH asthma requiring previous hospitalization, DM, HTN, obesity who presents to the emergency department for evaluation of shortness of breath.  Patient states that she has had symptoms for approximately 2 days.  He states that he recently ran out of his inhaler this morning and comes to the ER because of symptoms of not improved.  He endorses dyspnea on exertion and wheezing but denies chest pain, abdominal pain, nausea, vomiting, fever or other systemic symptoms.   Shortness of Breath Associated symptoms: cough and wheezing   Associated symptoms: no abdominal pain, no chest pain, no ear pain, no fever, no rash, no sore throat and no vomiting       Past Medical History:  Diagnosis Date   Asthma    Diabetes mellitus    Environmental allergies    Hypertension     Patient Active Problem List   Diagnosis Date Noted   Asthma 09/11/2017   Acute respiratory failure with hypoxemia (HCC) 09/10/2017   Obesity, Class III, BMI 40-49.9 (morbid obesity) (HCC) 09/10/2017   Asthma exacerbation 09/09/2017   Hypertension 09/09/2017   Abnormal EKG 09/09/2017   Morbid obesity (HCC) 03/13/2012   Prediabetes 03/13/2012   Impaired glucose tolerance 03/13/2012   Acanthosis 03/13/2012   Gynecomastia 03/13/2012    Past Surgical History:  Procedure Laterality Date   CARDIAC SURGERY     SMALL INTESTINE SURGERY     testicular removal         Family History  Problem Relation Age of Onset   Hypertension Mother    Obesity Mother    Cancer Maternal Grandmother    Diabetes Maternal Grandmother    Diabetes Maternal Grandfather    Hypertension Maternal Grandfather    Heart disease Maternal Grandfather    Stroke Maternal Grandfather     Social History   Tobacco Use    Smoking status: Never   Smokeless tobacco: Never   Tobacco comments:    vapor  Vaping Use   Vaping Use: Every day  Substance Use Topics   Alcohol use: Yes    Comment: occ   Drug use: Yes    Types: Marijuana    Home Medications Prior to Admission medications   Medication Sig Start Date End Date Taking? Authorizing Provider  albuterol (PROVENTIL HFA;VENTOLIN HFA) 108 (90 Base) MCG/ACT inhaler Inhale 2 puffs into the lungs every 4 (four) hours as needed. 09/11/17   Catarina Hartshorn, MD  cyclobenzaprine (FLEXERIL) 10 MG tablet Take 1 tablet (10 mg total) by mouth 2 (two) times daily as needed for muscle spasms. 02/24/18   Linwood Dibbles, MD  ibuprofen (ADVIL,MOTRIN) 600 MG tablet Take 1 tablet (600 mg total) by mouth every 6 (six) hours as needed. 02/24/18   Linwood Dibbles, MD  predniSONE (DELTASONE) 10 MG tablet Take 6 tablets (60 mg total) by mouth daily with breakfast. And decrease by one tablet daily 09/11/17   Tat, Onalee Hua, MD    Allergies    Bee venom  Review of Systems   Review of Systems  Constitutional:  Negative for chills and fever.  HENT:  Negative for ear pain and sore throat.   Eyes:  Negative for pain and visual disturbance.  Respiratory:  Positive for cough, chest tightness, shortness of breath and wheezing.  Cardiovascular:  Negative for chest pain and palpitations.  Gastrointestinal:  Negative for abdominal pain and vomiting.  Genitourinary:  Negative for dysuria and hematuria.  Musculoskeletal:  Negative for arthralgias and back pain.  Skin:  Negative for color change and rash.  Neurological:  Negative for seizures and syncope.  All other systems reviewed and are negative.  Physical Exam Updated Vital Signs BP (!) 139/111 (BP Location: Right Arm)   Pulse (!) 101   Temp 98.5 F (36.9 C) (Oral)   Resp (!) 21   Ht 6' (1.829 m)   Wt (!) 142.9 kg   SpO2 96%   BMI 42.72 kg/m   Physical Exam Vitals and nursing note reviewed.  Constitutional:      Appearance: He is  well-developed.  HENT:     Head: Normocephalic and atraumatic.  Eyes:     Conjunctiva/sclera: Conjunctivae normal.  Cardiovascular:     Rate and Rhythm: Normal rate and regular rhythm.     Heart sounds: No murmur heard. Pulmonary:     Effort: Pulmonary effort is normal. No respiratory distress.     Breath sounds: Wheezing present.  Abdominal:     Palpations: Abdomen is soft.     Tenderness: There is no abdominal tenderness.  Musculoskeletal:     Cervical back: Neck supple.  Skin:    General: Skin is warm and dry.  Neurological:     Mental Status: He is alert.    ED Results / Procedures / Treatments   Labs (all labs ordered are listed, but only abnormal results are displayed) Labs Reviewed - No data to display  EKG EKG Interpretation  Date/Time:  Saturday April 10 2021 12:41:04 EDT Ventricular Rate:  102 PR Interval:  183 QRS Duration: 99 QT Interval:  337 QTC Calculation: 439 R Axis:   44 Text Interpretation: Sinus tachycardia RSR' in V1 or V2, right VCD or RVH Confirmed by Canaan Holzer ([id number]) on 04/10/2021 1:41:48 PM  Radiology No results found.  Procedures Procedures   Medications Ordered in ED Medications  ipratropium-albuterol (DUONEB) 0.5-2.5 (3) MG/3ML nebulizer solution 3 mL (has no administration in time range)  predniSONE (DELTASONE) tablet 60 mg (60 mg Oral Given 04/10/21 1301)  ipratropium-albuterol (DUONEB) 0.5-2.5 (3) MG/3ML nebulizer solution 3 mL (3 mLs Nebulization Given 04/10/21 1302)    ED Course  I have reviewed the triage vital signs and the nursing notes.  Pertinent labs & imaging results that were available during my care of the patient were reviewed by me and considered in my medical decision making (see chart for details).    MDM Rules/Calculators/A&P                           Patient presents to the emergency department for evaluation of shortness of breath.  Physical exam reveals bilateral end-expiratory wheezing as well as an  abdominal scar but otherwise unremarkable.  Patient was given a single DuoNeb and 60 mg prednisone and reassessed which led to persistent wheezing in which he was given an additional DuoNeb.  On second reassessment, the patient has persistent wheezing and a third DuoNeb was given.  On third reassessment, patient still has expiratory wheezing and is satting 90% on room air.  Patient will require admission for q4 albuterol's and observation due to hypoxia of unknown origin.  Chest x-ray was obtained that shows possible consolidation in the left lower lobe likely consistent with atelectasis.  He has no chest pain, no fevers,  chills and I have low suspicion for pneumonia at this time.  Final Clinical Impression(s) / ED Diagnoses Final diagnoses:  None    Rx / DC Orders ED Discharge Orders     None        Niccolo Burggraf, MD 04/10/21 1500

## 2021-04-11 NOTE — Progress Notes (Signed)
On 04/11/21  I again attempted to call patient's cell phone at [phone]--- it goes straight to voicemail but voicemail is full--so unable to leave message   -on 04/11/21 ----Attempted multiple times unsuccessfully to reach patient's mother at [phone]--  Unable to Notify pt of covid positive test and the need to pick up Rx for Paxlovid from [facility] in [place]--  Shon Hale, MD

## 2021-04-11 NOTE — Progress Notes (Signed)
  Patient Active Problem List   Diagnosis Date Noted   COVID-19 virus infection 04/10/2021   Asthma exacerbation 09/09/2017   Impaired glucose tolerance 03/13/2012   Asthma 09/11/2017   Acute respiratory failure with hypoxemia (HCC) 09/10/2017   Obesity, Class III, BMI 40-49.9 (morbid obesity) (HCC) 09/10/2017   Hypertension 09/09/2017   Abnormal EKG 09/09/2017   Morbid obesity (HCC) 03/13/2012   Prediabetes 03/13/2012   Acanthosis 03/13/2012   Gynecomastia 03/13/2012     -the [employer] deputy was finally able to locate patient this afternoon - -I was able to speak with patient--and gave patient instructions below:-  1) You are strongly advised to isolate/quarantine for at least 10 days from the date of your diagnosis with COVID-19 infection--please always wear a mask if you have to go outside the house  2)Please take medications including Paxlovid , Prednisone , symbicort and prn albuterol inhalers as prescribed -Patient was able to pick up the above medications from [facility] pharmacy yesterday on 04/10/2021--- patient read off the names of the medications to me from the package/bottles while he was on the phone with me today--administration instructions reviewed with patient for each medication  3)Video/Virtual follow-up visit with primary care physician in about a week advised  Shon Hale, MD

## 2021-12-06 ENCOUNTER — Other Ambulatory Visit: Payer: Self-pay

## 2021-12-06 ENCOUNTER — Encounter (HOSPITAL_COMMUNITY): Payer: Self-pay

## 2021-12-06 ENCOUNTER — Emergency Department (HOSPITAL_COMMUNITY)
Admission: EM | Admit: 2021-12-06 | Discharge: 2021-12-06 | Disposition: A | Discharge status: Home / Self Care | Payer: Self-pay | Attending: Emergency Medicine | Admitting: Emergency Medicine

## 2021-12-06 ENCOUNTER — Emergency Department (HOSPITAL_COMMUNITY): Payer: Self-pay

## 2021-12-06 DIAGNOSIS — W208XXA Other cause of strike by thrown, projected or falling object, initial encounter: Secondary | ICD-10-CM | POA: Insufficient documentation

## 2021-12-06 DIAGNOSIS — S90111A Contusion of right great toe without damage to nail, initial encounter: Secondary | ICD-10-CM | POA: Insufficient documentation

## 2021-12-06 DIAGNOSIS — Y92810 Car as the place of occurrence of the external cause: Secondary | ICD-10-CM | POA: Insufficient documentation

## 2021-12-06 DIAGNOSIS — M79674 Pain in right toe(s): Secondary | ICD-10-CM | POA: Insufficient documentation

## 2021-12-06 DIAGNOSIS — Y9389 Activity, other specified: Secondary | ICD-10-CM | POA: Insufficient documentation

## 2021-12-06 DIAGNOSIS — Z5321 Procedure and treatment not carried out due to patient leaving prior to being seen by health care provider: Secondary | ICD-10-CM | POA: Insufficient documentation

## 2021-12-06 NOTE — ED Notes (Signed)
Patient couldn't wait any longer. AMA  ?

## 2021-12-06 NOTE — ED Provider Notes (Signed)
The patient eloped prior to my evaluation.  ?  ?Sherrell Puller, PA-C ?12/06/21 1905 ? ?  ?Fredia Sorrow, MD ?12/11/21 [phone] ? ?

## 2021-12-06 NOTE — ED Triage Notes (Signed)
Patient states he was working on his car and fell on his foot. Patient states pain to right big toe with bruising.  ?

## 2021-12-08 ENCOUNTER — Other Ambulatory Visit: Payer: Self-pay

## 2021-12-08 ENCOUNTER — Emergency Department (HOSPITAL_COMMUNITY): Payer: Self-pay

## 2021-12-08 ENCOUNTER — Emergency Department (HOSPITAL_COMMUNITY)
Admission: EM | Admit: 2021-12-08 | Discharge: 2021-12-08 | Disposition: A | Discharge status: Home / Self Care | Payer: Self-pay | Attending: Emergency Medicine | Admitting: Emergency Medicine

## 2021-12-08 ENCOUNTER — Encounter (HOSPITAL_COMMUNITY): Payer: Self-pay | Admitting: Emergency Medicine

## 2021-12-08 DIAGNOSIS — W208XXA Other cause of strike by thrown, projected or falling object, initial encounter: Secondary | ICD-10-CM | POA: Insufficient documentation

## 2021-12-08 DIAGNOSIS — J45901 Unspecified asthma with (acute) exacerbation: Secondary | ICD-10-CM

## 2021-12-08 DIAGNOSIS — S90111A Contusion of right great toe without damage to nail, initial encounter: Secondary | ICD-10-CM | POA: Insufficient documentation

## 2021-12-08 DIAGNOSIS — I10 Essential (primary) hypertension: Secondary | ICD-10-CM | POA: Insufficient documentation

## 2021-12-08 DIAGNOSIS — Z20822 Contact with and (suspected) exposure to covid-19: Secondary | ICD-10-CM | POA: Insufficient documentation

## 2021-12-08 DIAGNOSIS — J4541 Moderate persistent asthma with (acute) exacerbation: Secondary | ICD-10-CM | POA: Insufficient documentation

## 2021-12-08 DIAGNOSIS — L600 Ingrowing nail: Secondary | ICD-10-CM | POA: Insufficient documentation

## 2021-12-08 LAB — BLOOD GAS, ARTERIAL
Acid-Base Excess: 1.3 mmol/L (ref 0.0–2.0)
Bicarbonate: 27.6 mmol/L (ref 20.0–28.0)
Drawn by: 23430
FIO2: 21 %
O2 Saturation: 96 %
Patient temperature: 36.7
pCO2 arterial: 49 mmHg — ABNORMAL HIGH (ref 32–48)
pH, Arterial: 7.35 (ref 7.35–7.45)
pO2, Arterial: 76 mmHg — ABNORMAL LOW (ref 83–108)

## 2021-12-08 LAB — RESP PANEL BY RT-PCR (FLU A&B, COVID) ARPGX2
Influenza A by PCR: NEGATIVE
Influenza B by PCR: NEGATIVE
SARS Coronavirus 2 by RT PCR: NEGATIVE

## 2021-12-08 MED ORDER — ALBUTEROL SULFATE (2.5 MG/3ML) 0.083% IN NEBU: 2.5000 mg | INHALATION_SOLUTION | Freq: Four times a day (QID) | RESPIRATORY_TRACT | 0 refills | Status: DC | PRN

## 2021-12-08 MED ORDER — IPRATROPIUM-ALBUTEROL 0.5-2.5 (3) MG/3ML IN SOLN
3.0000 mL | Freq: Once | RESPIRATORY_TRACT | Status: AC
  Administered 2021-12-08: 3 mL via RESPIRATORY_TRACT
  Filled 2021-12-08: qty 3

## 2021-12-08 MED ORDER — ALBUTEROL SULFATE HFA 108 (90 BASE) MCG/ACT IN AERS: 1.0000 | INHALATION_SPRAY | Freq: Four times a day (QID) | RESPIRATORY_TRACT | 0 refills | Status: DC | PRN

## 2021-12-08 MED ORDER — PREDNISONE 20 MG PO TABS: 40.0000 mg | ORAL_TABLET | Freq: Every day | ORAL | 0 refills | Status: DC

## 2021-12-08 MED ORDER — METHYLPREDNISOLONE SODIUM SUCC 125 MG IJ SOLR
125.0000 mg | Freq: Once | INTRAMUSCULAR | Status: AC
  Administered 2021-12-08: 125 mg via INTRAVENOUS
  Filled 2021-12-08: qty 2

## 2021-12-08 MED ORDER — ALBUTEROL (5 MG/ML) CONTINUOUS INHALATION SOLN: 7.5000 mg/h | INHALATION_SOLUTION | Freq: Once | RESPIRATORY_TRACT | Status: DC

## 2021-12-08 MED ORDER — ALBUTEROL SULFATE (2.5 MG/3ML) 0.083% IN NEBU
INHALATION_SOLUTION | RESPIRATORY_TRACT | Status: AC
  Administered 2021-12-08: 7.5 mg
  Filled 2021-12-08: qty 9

## 2021-12-08 MED ORDER — CEPHALEXIN 500 MG PO CAPS: 500.0000 mg | ORAL_CAPSULE | Freq: Three times a day (TID) | ORAL | 0 refills | Status: DC

## 2021-12-08 NOTE — ED Triage Notes (Signed)
Pt to the ED with complaints of wheezing with a history of asthma and right foot pain. ? ?Pt states he is borderline diabetic and the axle of his car fell on his right foot. Pt had a negative xray 2 days ago. ? ?

## 2021-12-08 NOTE — ED Provider Notes (Signed)
?[facility] EMERGENCY DEPARTMENT ?Provider Note ? ? ?CSN: [id number] ?Arrival date & time: 12/08/21  0934 ? ?  ? ?History ? ?Chief Complaint  ?Patient presents with  ? Wheezing  ? ? ?Steve Livingston is a 26 y.o. male. ? ? ?Wheezing ?Associated symptoms: chest pain, chest tightness, cough and shortness of breath   ?Associated symptoms: no fever and no headaches   ? ?  ? ? ?Steve Livingston is a 26 y.o. male with past medical history significant for asthma, hypertension, who presents to the Emergency Department complaining of wheezing, cough, and shortness of breath.  He states that he typically uses an albuterol inhaler but ran out of his medication several months ago.  He states that when he has a asthma flare it typically resolves in a few days, his symptoms this time have been worse for 4 days.  He is having tightness across his upper chest that worsens when he coughs or takes a deep breath.  He states his cough has been productive of green to yellow sputum.  He denies any fever, chills, nausea or vomiting.  He also complains of pain to his right great toe x2 days.  States that a car axle fell on his foot several days ago.  He is having pain and discoloration along the nailbed.  He was here 2 days ago for evaluation of his foot and left prior to being seen by a provider.  He did have an x-ray of his foot at that time. ? ? ? ?Home Medications ?Prior to Admission medications   ?Medication Sig Start Date End Date Taking? Authorizing Provider  ?albuterol (VENTOLIN HFA) 108 (90 Base) MCG/ACT inhaler Inhale 2 puffs into the lungs every 4 (four) hours as needed for wheezing or shortness of breath. 04/10/21  Yes Emokpae, Courage, MD  ?budesonide-formoterol (SYMBICORT) 160-4.5 MCG/ACT inhaler Inhale 2 puffs into the lungs 2 (two) times daily. ?Patient not taking: Reported on 12/08/2021 04/10/21   Shon Hale, MD  ?cyclobenzaprine (FLEXERIL) 10 MG tablet Take 1 tablet (10 mg total) by mouth 2 (two) times daily as needed for  muscle spasms. ?Patient not taking: Reported on 12/08/2021 02/24/18   Linwood Dibbles, MD  ?   ? ?Allergies    ?Bee venom   ? ?Review of Systems   ?Review of Systems  ?Constitutional:  Negative for appetite change, chills and fever.  ?Respiratory:  Positive for cough, chest tightness, shortness of breath and wheezing.   ?Cardiovascular:  Positive for chest pain. Negative for palpitations.  ?Gastrointestinal:  Negative for abdominal pain, nausea and vomiting.  ?Musculoskeletal:  Positive for arthralgias (Right great toe pain and swelling).  ?Neurological:  Negative for dizziness and headaches.  ? ?Physical Exam ?Updated Vital Signs ?BP (!) 150/78   Pulse 89   Temp 98 ?F (36.7 ?C) (Oral)   Resp 20   Ht 6' (1.829 m)   Wt 135.2 kg   SpO2 95%   BMI 40.42 kg/m?  ?Physical Exam ?Vitals and nursing note reviewed.  ?Constitutional:   ?   General: He is not in acute distress. ?   Appearance: Normal appearance.  ?   Comments: Patient well-appearing, nontoxic  ?HENT:  ?   Right Ear: Tympanic membrane and ear canal normal.  ?   Left Ear: Tympanic membrane and ear canal normal.  ?Cardiovascular:  ?   Rate and Rhythm: Normal rate and regular rhythm.  ?   Pulses: Normal pulses.  ?Pulmonary:  ?   Effort: Pulmonary effort is normal.  ?  Breath sounds: Wheezing present.  ?   Comments: Diffuse expiratory wheezes with diminished lung sounds bilaterally.  No rales.  Patient able to speak in full and complete sentences without respiratory distress. ?Abdominal:  ?   Palpations: Abdomen is soft.  ?   Tenderness: There is no abdominal tenderness.  ?Musculoskeletal:     ?   General: Tenderness and signs of injury present.  ?   Cervical back: Normal range of motion.  ?   Right lower leg: No edema.  ?   Left lower leg: No edema.  ?   Comments: Tenderness and ecchymosis noted at the lateral aspect of the right great toe.  There is some dried blood and slight purulent drainage noted along the cuticle.  No subungual hematoma. Nail is intact   ?Lymphadenopathy:  ?   Cervical: No cervical adenopathy.  ?Skin: ?   General: Skin is warm.  ?   Capillary Refill: Capillary refill takes less than 2 seconds.  ?   Findings: No rash.  ?Neurological:  ?   General: No focal deficit present.  ?   Mental Status: He is alert.  ?   Sensory: No sensory deficit.  ?   Motor: No weakness.  ? ? ?ED Results / Procedures / Treatments   ?Labs ?(all labs ordered are listed, but only abnormal results are displayed) ?Labs Reviewed  ?BLOOD GAS, ARTERIAL - Abnormal; Notable for the following components:  ?    Result Value  ? pCO2 arterial 49 (*)   ? pO2, Arterial 76 (*)   ? All other components within normal limits  ?RESP PANEL BY RT-PCR (FLU A&B, COVID) ARPGX2  ? ? ?EKG ?None ? ?Radiology ?DG Chest 2 View ? ?Result Date: 12/08/2021 ?CLINICAL DATA:  26 year old male presents with cough wheezing, history of asthma. EXAM: CHEST - 2 VIEW COMPARISON:  April 10, 2021. FINDINGS: Cardiomediastinal contours and hilar structures are stable. Mild central airway thickening and peribronchial cuffing. No signs of lobar consolidation. No pneumothorax. No evidence of pleural effusion. On limited assessment there is no acute skeletal process. IMPRESSION: Mild central airway thickening and peribronchial cuffing could be seen in the setting of reactive airway disease or asthma. No lobar consolidation or effusion. Electronically Signed   By: Donzetta KohutGeoffrey  Wile M.D.   On: 12/08/2021 10:52   ? ?Procedures ?Procedures  ? ? ?Medications Ordered in ED ?Medications  ?methylPREDNISolone sodium succinate (SOLU-MEDROL) 125 mg/2 mL injection 125 mg (has no administration in time range)  ?albuterol (PROVENTIL,VENTOLIN) solution continuous neb (has no administration in time range)  ? ? ?ED Course/ Medical Decision Making/ A&P ?  ?                        ?Medical Decision Making ?Patient here for evaluation of cough, shortness of breath, and wheezing.  Has history of asthma and has been out of his albuterol inhaler  times several months. ? ?On exam, patient without obvious respiratory distress.  No hypoxia tachypnea or tachycardia.  He is able to speak in full sentences without respiratory distress.  He does have some expiratory wheezes throughout.  Lung sounds slightly diminished on my exam.  His right great toe is tender to the lateral aspect with bruising along the distal and.  Nail appears intact.  There is some dried blood noted. ? ?Amount and/or Complexity of Data Reviewed ?External Data Reviewed: notes. ?   Details: Prior medical records reviewed by me ?Labs: ordered. ?  Details: Labs today without significant abnormality.  COVID flu test negative. ?Radiology: ordered. ?   Details: Patient here 2 days ago had x-ray of the right great toe, without evidence of acute fracture or dislocation. ? ?Chest x-ray today shows no evidence of focal infiltrate ? ?Risk ?Prescription drug management. ? ? ?Pt with known hx of asthma, here for wheezing, and ran out of his inhaler.  No respiratory distress, no hypoxia.  Likely asthma exacerbation.   ? ?On recheck, pt reports feeling better after nebs.  Lung sounds much improved. ?Great toe contusion with likely ingrown nail. No abscess ? ?Patient ambulated in the department maintained O2 sat of 99% on room air.  Appropriate for d/c home, return precautions discussed.  ? ? ? ? ? ? ? ?Final Clinical Impression(s) / ED Diagnoses ?Final diagnoses:  ?Moderate asthma with exacerbation, unspecified whether persistent  ?Ingrown right greater toenail  ?Contusion of right great toe without damage to nail, initial encounter  ? ? ?Rx / DC Orders ?ED Discharge Orders   ? ? None  ? ?  ? ? ?  ?Pauline Aus, PA-C ?12/09/21 2134 ? ?  ?Eber Hong, MD ?12/11/21 [phone] ? ?

## 2021-12-08 NOTE — Discharge Instructions (Signed)
The x-ray of your foot did not show evidence of a fracture or dislocation.  You likely have an ingrown toenail.  I recommend that you soak your foot in warm salt water 2-3 times a day.  Take the antibiotic as directed.  I have prescribed albuterol vials for your nebulizer machine, you may use the albuterol inhaler if needed.  Please follow-up with your primary care provider for recheck or return to the emergency department for any new or worsening symptoms.  Call Dr. Loralie Champagne office to arrange follow-up appointment regarding your toe pain. ?

## 2022-03-28 ENCOUNTER — Observation Stay (HOSPITAL_COMMUNITY)
Admission: EM | Admit: 2022-03-28 | Discharge: 2022-03-29 | Disposition: A | Discharge status: Home / Self Care | Payer: Self-pay | Attending: Internal Medicine | Admitting: Internal Medicine

## 2022-03-28 ENCOUNTER — Other Ambulatory Visit: Payer: Self-pay

## 2022-03-28 ENCOUNTER — Encounter (HOSPITAL_COMMUNITY): Payer: Self-pay

## 2022-03-28 ENCOUNTER — Emergency Department (HOSPITAL_COMMUNITY): Payer: Self-pay

## 2022-03-28 DIAGNOSIS — I10 Essential (primary) hypertension: Secondary | ICD-10-CM | POA: Insufficient documentation

## 2022-03-28 DIAGNOSIS — J4521 Mild intermittent asthma with (acute) exacerbation: Secondary | ICD-10-CM

## 2022-03-28 DIAGNOSIS — Z800 Family history of malignant neoplasm of digestive organs: Secondary | ICD-10-CM | POA: Insufficient documentation

## 2022-03-28 DIAGNOSIS — R7303 Prediabetes: Secondary | ICD-10-CM

## 2022-03-28 DIAGNOSIS — Z6841 Body Mass Index (BMI) 40.0 and over, adult: Secondary | ICD-10-CM | POA: Insufficient documentation

## 2022-03-28 DIAGNOSIS — Z79899 Other long term (current) drug therapy: Secondary | ICD-10-CM | POA: Insufficient documentation

## 2022-03-28 DIAGNOSIS — J45901 Unspecified asthma with (acute) exacerbation: Secondary | ICD-10-CM | POA: Diagnosis present

## 2022-03-28 DIAGNOSIS — Z20822 Contact with and (suspected) exposure to covid-19: Secondary | ICD-10-CM | POA: Insufficient documentation

## 2022-03-28 DIAGNOSIS — J4551 Severe persistent asthma with (acute) exacerbation: Principal | ICD-10-CM | POA: Insufficient documentation

## 2022-03-28 DIAGNOSIS — E876 Hypokalemia: Secondary | ICD-10-CM | POA: Insufficient documentation

## 2022-03-28 DIAGNOSIS — K219 Gastro-esophageal reflux disease without esophagitis: Secondary | ICD-10-CM | POA: Insufficient documentation

## 2022-03-28 LAB — BASIC METABOLIC PANEL
Anion gap: 3 — ABNORMAL LOW (ref 5–15)
BUN: 5 mg/dL — ABNORMAL LOW (ref 6–20)
CO2: 27 mmol/L (ref 22–32)
Calcium: 8.1 mg/dL — ABNORMAL LOW (ref 8.9–10.3)
Chloride: 105 mmol/L (ref 98–111)
Creatinine, Ser: 0.83 mg/dL (ref 0.61–1.24)
GFR, Estimated: >60 mL/min (ref >60)
Glucose, Bld: 104 mg/dL — ABNORMAL HIGH (ref 70–99)
Potassium: 2.9 mmol/L — ABNORMAL LOW (ref 3.5–5.1)
Sodium: 135 mmol/L (ref 135–145)

## 2022-03-28 LAB — CBC WITH DIFFERENTIAL/PLATELET
Abs Immature Granulocytes: 0.02 10*3/uL (ref 0.00–0.07)
Basophils Absolute: 0.1 10*3/uL (ref 0.0–0.1)
Basophils Relative: 1 %
Eosinophils Absolute: 0.1 10*3/uL (ref 0.0–0.5)
Eosinophils Relative: 1 %
HCT: 35.5 % — ABNORMAL LOW (ref 39.0–52.0)
Hemoglobin: 11.1 g/dL — ABNORMAL LOW (ref 13.0–17.0)
Immature Granulocytes: 0 %
Lymphocytes Relative: 54 %
Lymphs Abs: 4.8 10*3/uL — ABNORMAL HIGH (ref 0.7–4.0)
MCH: 27.4 pg (ref 26.0–34.0)
MCHC: 31.3 g/dL (ref 30.0–36.0)
MCV: 87.7 fL (ref 80.0–100.0)
Monocytes Absolute: 0.5 10*3/uL (ref 0.1–1.0)
Monocytes Relative: 6 %
Neutro Abs: 3.4 10*3/uL (ref 1.7–7.7)
Neutrophils Relative %: 38 %
Platelets: 207 10*3/uL (ref 150–400)
RBC: 4.05 MIL/uL — ABNORMAL LOW (ref 4.22–5.81)
RDW: 13.6 % (ref 11.5–15.5)
Smear Review: NORMAL
WBC: 8.8 10*3/uL (ref 4.0–10.5)
nRBC: 0 % (ref 0.0–0.2)

## 2022-03-28 LAB — RESP PANEL BY RT-PCR (FLU A&B, COVID) ARPGX2
Influenza A by PCR: NEGATIVE
Influenza B by PCR: NEGATIVE
SARS Coronavirus 2 by RT PCR: NEGATIVE

## 2022-03-28 MED ORDER — SODIUM CHLORIDE 0.9% FLUSH
3.0000 mL | Freq: Two times a day (BID) | INTRAVENOUS | Status: DC
  Administered 2022-03-28: 3 mL via INTRAVENOUS

## 2022-03-28 MED ORDER — METHYLPREDNISOLONE SODIUM SUCC 125 MG IJ SOLR
125.0000 mg | Freq: Once | INTRAMUSCULAR | Status: AC
  Administered 2022-03-28: 125 mg via INTRAVENOUS
  Filled 2022-03-28: qty 2

## 2022-03-28 MED ORDER — PREDNISONE 20 MG PO TABS
30.0000 mg | ORAL_TABLET | Freq: Every day | ORAL | Status: AC
  Administered 2022-03-29: 30 mg via ORAL
  Filled 2022-03-28: qty 2

## 2022-03-28 MED ORDER — ENOXAPARIN SODIUM 40 MG/0.4ML IJ SOSY
40.0000 mg | PREFILLED_SYRINGE | INTRAMUSCULAR | Status: DC
  Administered 2022-03-28: 40 mg via SUBCUTANEOUS
  Filled 2022-03-28: qty 0.4

## 2022-03-28 MED ORDER — PREDNISONE 20 MG PO TABS: 40.0000 mg | ORAL_TABLET | Freq: Every day | ORAL | Status: DC

## 2022-03-28 MED ORDER — IPRATROPIUM-ALBUTEROL 0.5-2.5 (3) MG/3ML IN SOLN
3.0000 mL | Freq: Once | RESPIRATORY_TRACT | Status: AC
  Administered 2022-03-28: 3 mL via RESPIRATORY_TRACT
  Filled 2022-03-28: qty 3

## 2022-03-28 MED ORDER — ALBUTEROL SULFATE (2.5 MG/3ML) 0.083% IN NEBU
7.5000 mg/h | INHALATION_SOLUTION | RESPIRATORY_TRACT | Status: AC
  Administered 2022-03-28: 7.5 mg/h via RESPIRATORY_TRACT
  Filled 2022-03-28: qty 9

## 2022-03-28 MED ORDER — SODIUM CHLORIDE 0.9 % IV SOLN: 250.0000 mL | INTRAVENOUS | Status: DC | PRN

## 2022-03-28 MED ORDER — SODIUM CHLORIDE 0.9% FLUSH: 3.0000 mL | INTRAVENOUS | Status: DC | PRN

## 2022-03-28 MED ORDER — ALBUTEROL SULFATE (2.5 MG/3ML) 0.083% IN NEBU
INHALATION_SOLUTION | RESPIRATORY_TRACT | Status: AC
  Filled 2022-03-28: qty 6

## 2022-03-28 MED ORDER — IPRATROPIUM-ALBUTEROL 0.5-2.5 (3) MG/3ML IN SOLN
3.0000 mL | Freq: Four times a day (QID) | RESPIRATORY_TRACT | Status: DC
  Administered 2022-03-29: 3 mL via RESPIRATORY_TRACT
  Filled 2022-03-28 (×3): qty 3

## 2022-03-28 MED ORDER — ALBUTEROL SULFATE (5 MG/ML) 0.5% IN NEBU
5.0000 mg | INHALATION_SOLUTION | Freq: Once | RESPIRATORY_TRACT | Status: AC
  Administered 2022-03-28: 5 mg via RESPIRATORY_TRACT
  Filled 2022-03-28: qty 1

## 2022-03-28 MED ORDER — ACETAMINOPHEN 650 MG RE SUPP: 650.0000 mg | Freq: Four times a day (QID) | RECTAL | Status: DC | PRN

## 2022-03-28 MED ORDER — ZOLPIDEM TARTRATE 5 MG PO TABS: 5.0000 mg | ORAL_TABLET | Freq: Every evening | ORAL | Status: DC | PRN

## 2022-03-28 MED ORDER — PREDNISONE 20 MG PO TABS: 20.0000 mg | ORAL_TABLET | Freq: Every day | ORAL | Status: DC

## 2022-03-28 MED ORDER — POTASSIUM CHLORIDE CRYS ER 20 MEQ PO TBCR
20.0000 meq | EXTENDED_RELEASE_TABLET | Freq: Two times a day (BID) | ORAL | Status: DC
  Administered 2022-03-28 – 2022-03-29 (×2): 20 meq via ORAL
  Filled 2022-03-28 (×2): qty 1

## 2022-03-28 MED ORDER — LOSARTAN POTASSIUM 50 MG PO TABS
25.0000 mg | ORAL_TABLET | Freq: Every day | ORAL | Status: DC
  Administered 2022-03-28 – 2022-03-29 (×2): 25 mg via ORAL
  Filled 2022-03-28 (×2): qty 1

## 2022-03-28 MED ORDER — ACETAMINOPHEN 325 MG PO TABS: 650.0000 mg | ORAL_TABLET | Freq: Four times a day (QID) | ORAL | Status: DC | PRN

## 2022-03-28 NOTE — ED Provider Notes (Signed)
[facility] EMERGENCY DEPARTMENT Provider Note   CSN: [id number] Arrival date & time: 03/28/22  1056     History  Chief Complaint  Patient presents with   Cough    Steve Livingston is a 26 y.o. male with a history including asthma, hypertension, prior history of COVID-19, and prediabetes presenting for evaluation of a suspected asthma flare which started yesterday.  He has complaints of a nonproductive cough along with wheezing and shortness of breath which started yesterday.  He has used his albuterol inhaler without significant improvement.  He states he has fairly well controlled asthma most of the year, but runs into trouble in the hottest of weather, he has had no significant symptoms until the past couple of days.  On average during the summertime he will use his rescue inhaler once every 2 to 3 weeks.  He has uses albuterol since yesterday with no significant improvement.  He denies fevers, chest pain, also no palpitation, nausea or vomiting.  He does endorse significant shortness of breath with exertion.  He states he was last admitted to the hospital for an asthma exacerbation in 2019.  He was never intubated.  The history is provided by the patient.       Home Medications Prior to Admission medications   Medication Sig Start Date End Date Taking? Authorizing Provider  acetaminophen (TYLENOL) 500 MG tablet Take 1,000 mg by mouth every 6 (six) hours as needed.   Yes [provider]  albuterol (PROVENTIL) (2.5 MG/3ML) 0.083% nebulizer solution Take 3 mLs (2.5 mg total) by nebulization every 6 (six) hours as needed for wheezing or shortness of breath. 12/08/21  Yes Triplett, Tammy, PA-C  albuterol (VENTOLIN HFA) 108 (90 Base) MCG/ACT inhaler Inhale 1-2 puffs into the lungs every 6 (six) hours as needed for wheezing or shortness of breath. 12/08/21  Yes Triplett, Tammy, PA-C  Dextromethorphan HBr (DELSYM PO) Take by mouth.   Yes [provider]  predniSONE (DELTASONE) 20  MG tablet Take 2 tablets (40 mg total) by mouth daily. Patient not taking: Reported on 03/28/2022 12/08/21   Pauline Aus, PA-C      Allergies    Bee venom    Review of Systems   Review of Systems  Constitutional:  Negative for fever.  HENT:  Negative for congestion and sore throat.   Eyes: Negative.   Respiratory:  Positive for shortness of breath and wheezing. Negative for chest tightness.   Cardiovascular:  Negative for chest pain.  Gastrointestinal:  Negative for abdominal pain and nausea.  Genitourinary: Negative.   Musculoskeletal:  Negative for arthralgias, joint swelling and neck pain.  Skin: Negative.  Negative for rash and wound.  Neurological:  Negative for dizziness, weakness, light-headedness, numbness and headaches.  Psychiatric/Behavioral: Negative.      Physical Exam Updated Vital Signs BP (!) 158/102   Pulse (!) 101   Temp 98.1 F (36.7 C) (Oral)   Resp 15   Ht 6' (1.829 m)   Wt 133.8 kg   SpO2 95%   BMI 40.01 kg/m  Physical Exam Vitals and nursing note reviewed.  Constitutional:      Appearance: He is well-developed.  HENT:     Head: Normocephalic and atraumatic.  Eyes:     Conjunctiva/sclera: Conjunctivae normal.  Cardiovascular:     Rate and Rhythm: Normal rate and regular rhythm.     Heart sounds: Normal heart sounds.  Pulmonary:     Effort: Pulmonary effort is normal.     Breath  sounds: Normal breath sounds. No wheezing.  Abdominal:     General: Bowel sounds are normal.     Palpations: Abdomen is soft.     Tenderness: There is no abdominal tenderness.  Musculoskeletal:        General: Normal range of motion.     Cervical back: Normal range of motion.  Skin:    General: Skin is warm and dry.  Neurological:     Mental Status: He is alert.     ED Results / Procedures / Treatments   Labs (all labs ordered are listed, but only abnormal results are displayed) Labs Reviewed  CBC WITH DIFFERENTIAL/PLATELET - Abnormal; Notable for the  following components:      Result Value   RBC 4.05 (*)    Hemoglobin 11.1 (*)    HCT 35.5 (*)    Lymphs Abs 4.8 (*)    All other components within normal limits  BASIC METABOLIC PANEL - Abnormal; Notable for the following components:   Potassium 2.9 (*)    Glucose, Bld 104 (*)    BUN 5 (*)    Calcium 8.1 (*)    Anion gap 3 (*)    All other components within normal limits  RESP PANEL BY RT-PCR (FLU A&B, COVID) ARPGX2    EKG None  Radiology DG Chest 2 View  Result Date: 03/28/2022 CLINICAL DATA:  Cough for 2 days. EXAM: CHEST - 2 VIEW COMPARISON:  Chest radiograph April 1923 FINDINGS: The heart size and mediastinal contours are within normal limits. Both lungs are clear. The visualized skeletal structures are unremarkable. IMPRESSION: No active cardiopulmonary disease. Electronically Signed   By: Annia Belt M.D.   On: 03/28/2022 11:39    Procedures Procedures    Medications Ordered in ED Medications  albuterol (PROVENTIL) (2.5 MG/3ML) 0.083% nebulizer solution (0 mg/hr Nebulization Stopped 03/28/22 1636)  albuterol (PROVENTIL) (2.5 MG/3ML) 0.083% nebulizer solution (  Not Given 03/28/22 1935)  ipratropium-albuterol (DUONEB) 0.5-2.5 (3) MG/3ML nebulizer solution 3 mL (3 mLs Nebulization Given 03/28/22 1254)  methylPREDNISolone sodium succinate (SOLU-MEDROL) 125 mg/2 mL injection 125 mg (125 mg Intravenous Given 03/28/22 1533)  albuterol (PROVENTIL) (5 MG/ML) 0.5% nebulizer solution 5 mg (5 mg Nebulization Given 03/28/22 1926)    ED Course/ Medical Decision Making/ A&P Clinical Course as of 03/28/22 1950  Mon Mar 28, 2022  1650 Pt continued to wheeze and have sob after first neb given.  Repeated 7.5 mg albuterol over 60 minutes,  solumedrol 125 mg IM given.  After tx,  pt felt comfortable with his breathing but had reduced breath sounds, tight,  upper field wheezing.  O2 sats at 80% on room air,  improved to 86% on 2L.  Increased to 3L without improvement.  He will benefit from admission  and is agreeable to this. [JI]  1715 Pt now 93% on 3L,  still needing O2 requirement.  [JI]  1910 Recheck of patient,  off oxygen drops to 88% on room air, at rest.  Pulse rate improved,  feel he can tolerate another albuterol neb, ordered.   [JI]    Clinical Course User Index [JI] Victoriano Lain                           Medical Decision Making Pt with asthma flare, still with hypoxia on room air, improved on nasal cannula.  He is wheezing in the upper lobes, less air movement lower fields,  covid negative, cxr negative  for pneumonia.  No leg swelling, unilateral leg pain, not sudden onset, PE unlikely.  H/o asthma with acute exacerbation.   Amount and/or Complexity of Data Reviewed Labs: ordered.    Details: Basic screening labs were ordered once it was determined that he was not adequately responding to the neb treatments and would need admission.  He is COVID and flu negative. Radiology: ordered.    Details: No active cardiopulmonary disease. Discussion of management or test interpretation with external provider(s): Call to hospitalist service for admission. Discussed with Dr. Debby Bud who accepts pt for admission.  Risk Decision regarding hospitalization.           Final Clinical Impression(s) / ED Diagnoses Final diagnoses:  Severe persistent asthma with exacerbation    Rx / DC Orders ED Discharge Orders     None         Victoriano Lain 03/28/22 1950    Sloan Leiter, DO 04/01/22 0740

## 2022-03-28 NOTE — Assessment & Plan Note (Signed)
Elevated BP today and in the past. Positive family history.  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

## 2022-03-28 NOTE — H&P (Signed)
History and Physical    Steve Livingston ZOX:[id number] DOB: 02/01/1996 DOA: 03/28/2022  DOS: the patient was seen and examined on 03/28/2022  PCP: Patient, No Pcp Per   Patient coming from: Home  I have personally briefly reviewed patient's old medical records in [facility]  Steve Livingston is a 26 y/o man with h/o seasonal asthma and hypertension. He was in his usual health until recently when he developed increased wheezing and SOB, typical for summer time exacerbation of his asthma. HIs last flare was in April '23. He self-medicated with albuterol MDI which was ineffective. He presented to [facility] for evaluation and treatment.     ED Course: Afebrile, BP 189/90  HR 107 RR 10. He was noted to be wheezing and SOB. O2 sats were dropping into the 80'2. He was administered a duoneb treatment but symptoms persisted. He was then given a 1 hr albuterol treatment and IV solumedrol. Due to persistent wheezing and hypoxemia TR[facility] was asked to admit on observation for continued treatmnet.   Review of Systems:  Review of Systems  Constitutional:  Positive for chills and fever.  HENT: Negative.    Eyes: Negative.   Respiratory:  Positive for cough, shortness of breath and wheezing. Negative for hemoptysis and sputum production.   Cardiovascular: Negative.   Gastrointestinal: Negative.   Genitourinary: Negative.   Musculoskeletal: Negative.   Skin: Negative.   Neurological: Negative.   Endo/Heme/Allergies: Negative.   Psychiatric/Behavioral: Negative.      Past Medical History:  Diagnosis Date   Asthma    Environmental allergies    Hypertension     Past Surgical History:  Procedure Laterality Date   testicular removal     Soc Hx - lives with fiance. Has a 2 y/o son. 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 He did not enroll in employer provided insurance plan.   reports that he has never smoked. He has never used smokeless tobacco. He  reports current alcohol use. He reports current drug use. Frequency: 7.00 times per week. Drug: Marijuana.  Allergies  Allergen Reactions   Bee Venom Swelling    Family History  Problem Relation Age of Onset   Hypertension Mother    Obesity Mother    Colon cancer Father    Hypertension Sister    Hypertension Brother    Cancer Maternal Grandmother    Diabetes Maternal Grandmother    Diabetes Maternal Grandfather    Hypertension Maternal Grandfather    Heart disease Maternal Grandfather    Stroke Maternal Grandfather     Prior to Admission medications   Medication Sig Start Date End Date Taking? Authorizing Provider  acetaminophen (TYLENOL) 500 MG tablet Take 1,000 mg by mouth every 6 (six) hours as needed.   Yes [provider]  albuterol (PROVENTIL) (2.5 MG/3ML) 0.083% nebulizer solution Take 3 mLs (2.5 mg total) by nebulization every 6 (six) hours as needed for wheezing or shortness of breath. 12/08/21  Yes Triplett, Tammy, PA-C  albuterol (VENTOLIN HFA) 108 (90 Base) MCG/ACT inhaler Inhale 1-2 puffs into the lungs every 6 (six) hours as needed for wheezing or shortness of breath. 12/08/21  Yes Triplett, Tammy, PA-C  Dextromethorphan HBr (DELSYM PO) Take by mouth.   Yes [provider]  predniSONE (DELTASONE) 20 MG tablet Take 2 tablets (40 mg total) by mouth daily. Patient not taking: Reported on 03/28/2022 12/08/21   Pauline Aus, PA-C    Physical Exam:  Vitals:   03/28/22 1822 03/28/22 1926 03/28/22 2000 03/28/22 2100  BP:   (!) 160/90 (!) 155/80  Pulse: (!) 101  (!) 107 (!) 105  Resp: 15  10 19   Temp:    98.1 F (36.7 C)  TempSrc:      SpO2: 96% 95% 91% 94%  Weight:      Height:        Physical Exam Vitals and nursing note reviewed.  Constitutional:      General: He is not in acute distress.    Appearance: He is obese. He is not ill-appearing.  HENT:     Head: Normocephalic and atraumatic.     Nose: Nose normal.     Mouth/Throat:     Mouth:  Mucous membranes are moist.     Pharynx: Oropharynx is clear.  Eyes:     Extraocular Movements: Extraocular movements intact.     Conjunctiva/sclera: Conjunctivae normal.     Pupils: Pupils are equal, round, and reactive to light.  Cardiovascular:     Rate and Rhythm: Normal rate and regular rhythm.     Pulses: Normal pulses.     Heart sounds: Normal heart sounds.  Pulmonary:     Effort: Pulmonary effort is normal. No respiratory distress.     Breath sounds: No wheezing, rhonchi or rales.  Abdominal:     General: Bowel sounds are normal.     Palpations: Abdomen is soft.  Musculoskeletal:     Cervical back: Normal range of motion and neck supple.  Skin:    General: Skin is warm and dry.  Neurological:     General: No focal deficit present.     Mental Status: He is alert and oriented to person, place, and time.  Psychiatric:        Mood and Affect: Mood normal.      Labs on Admission: I have personally reviewed following labs and imaging studies  CBC: Recent Labs  Lab 03/28/22 1657  WBC 8.8  NEUTROABS 3.4  HGB 11.1*  HCT 35.5*  MCV 87.7  PLT 207   Basic Metabolic Panel: Recent Labs  Lab 03/28/22 1657  NA 135  K 2.9*  CL 105  CO2 27  GLUCOSE 104*  BUN 5*  CREATININE 0.83  CALCIUM 8.1*   GFR: Estimated Creatinine Clearance: 194.3 mL/min (by C-G formula based on SCr of 0.83 mg/dL). Liver Function Tests: No results for input(s): "AST", "ALT", "ALKPHOS", "BILITOT", "PROT", "ALBUMIN" in the last 168 hours. No results for input(s): "LIPASE", "AMYLASE" in the last 168 hours. No results for input(s): "AMMONIA" in the last 168 hours. Coagulation Profile: No results for input(s): "INR", "PROTIME" in the last 168 hours. Cardiac Enzymes: No results for input(s): "CKTOTAL", "CKMB", "CKMBINDEX", "TROPONINI" in the last 168 hours. BNP (last 3 results) No results for input(s): "PROBNP" in the last 8760 hours. HbA1C: No results for input(s): "HGBA1C" in the last 72  hours. CBG: No results for input(s): "GLUCAP" in the last 168 hours. Lipid Profile: No results for input(s): "CHOL", "HDL", "LDLCALC", "TRIG", "CHOLHDL", "LDLDIRECT" in the last 72 hours. Thyroid Function Tests: No results for input(s): "TSH", "T4TOTAL", "FREET4", "T3FREE", "THYROIDAB" in the last 72 hours. Anemia Panel: No results for input(s): "VITAMINB12", "FOLATE", "FERRITIN", "TIBC", "IRON", "RETICCTPCT" in the last 72 hours. Urine analysis: No results found for: "COLORURINE", "APPEARANCEUR", "LABSPEC", "PHURINE", "GLUCOSEU", "HGBUR", "BILIRUBINUR", "KETONESUR", "PROTEINUR", "UROBILINOGEN", "NITRITE", "LEUKOCYTESUR"  Radiological Exams on Admission: I have personally reviewed images DG Chest 2 View  Result Date: 03/28/2022  CLINICAL DATA:  Cough for 2 days. EXAM: CHEST - 2 VIEW COMPARISON:  Chest radiograph April 1923 FINDINGS: The heart size and mediastinal contours are within normal limits. Both lungs are clear. The visualized skeletal structures are unremarkable. IMPRESSION: No active cardiopulmonary disease. Electronically Signed   By: Annia Belt M.D.   On: 03/28/2022 11:39    EKG: I have personally reviewed EKG: no EKG on chart  Assessment/Plan Principal Problem:   Asthma exacerbation Active Problems:   Hypertension   Family history of colon cancer in father    Assessment and Plan: * Asthma exacerbation Patient with seasonal asthma - rare occurrences. Has h/o using SABA, albuterol, prn. Does not take LABA even in season. Does not have primary care provider.  Plan Med-surg obs  Duoneb q6  Prednisone 40 mg/d x `1, 30 mg/d x 1 then 20 mg/d x 4  Establish with PCP  Hypertension Elevated BP today and in the past. Positive family history.  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       DVT prophylaxis: Lovenox Code Status: Full Code Family Communication: family was on the phone during interview and exam  Disposition Plan: home in 24-48 hrs  Consults called: none  Admission status: Observation, Med-Surg   Illene Regulus, MD [facility] 03/28/2022, 9:47 PM

## 2022-03-28 NOTE — Assessment & Plan Note (Signed)
Father diagnosed at 25 y/o with colon cancer  Plan Patient advised to start screening colonoscopies at at 47.

## 2022-03-28 NOTE — Subjective & Objective (Signed)
Mr. Steve Livingston is a 26 y/o man with h/o seasonal asthma and hypertension. He was in his usual health until recently when he developed increased wheezing and SOB, typical for summer time exacerbation of his asthma. HIs last flare was in April '23. He self-medicated with albuterol MDI which was ineffective. He presented to [facility] for evaluation and treatment.

## 2022-03-28 NOTE — Assessment & Plan Note (Addendum)
-  Patient with mild persistent asthma  -Previously seasonal events; has become more frequent and lacking to respond to the use of just albuterol bronchodilator as needed.   -Excellent response to the use of his steroids and hospital nebulizer treatments.   -No requiring oxygen supplementation at discharge. -Resewn as needed albuterol and started on Qvar for maintenance therapy.   -Outpatient follow-up with pulmonologist recommended. -Patient will complete outpatient steroids tapering process.

## 2022-03-28 NOTE — Progress Notes (Addendum)
Still a slight wheeze more on the right side.  Gave Albuterol treatment.  Asked patient to sit up and cough some, hopefully it will clear better.  Sats went up when patient sat up as well.  Patient laying flat; sat on 2L was 92%, when patient sat up, sat went up to 98% on 2L.  Patient stated he was laying flat because his back started bothering him.

## 2022-03-28 NOTE — ED Provider Triage Note (Signed)
Emergency Medicine Provider Triage Evaluation Note  Kelwin Gibler , a 26 y.o. male  was evaluated in triage.  Pt complains of shortness of breath, asthma exacerbation.  Patient reports that started around 2 days ago, he reports he has had some congestion, cough, sore throat from cough.  Patient reports he is out of both of his inhalers..  Review of Systems  Positive: Shortness of breath, cough, sore throat, runny nose Negative: Fever, chills, nausea, vomiting  Physical Exam  BP 130/84 (BP Location: Right Arm)   Pulse 98   Temp 98.1 F (36.7 C) (Oral)   Resp (!) 24   Ht 6' (1.829 m)   Wt 133.8 kg   SpO2 90%   BMI 40.01 kg/m  Gen:   Awake, no distress   Resp:  Increased effort, tachypnea, wheezing in bil lung fields MSK:   Moves extremities without difficulty  Other:    Medical Decision Making  Medically screening exam initiated at 11:26 AM.  Appropriate orders placed.  Delia Haefner was informed that the remainder of the evaluation will be completed by another provider, this initial triage assessment does not replace that evaluation, and the importance of remaining in the ED until their evaluation is complete.  Workup initiated   Olene Floss, [place] 03/28/22 1128

## 2022-03-28 NOTE — ED Triage Notes (Signed)
Patient with complaints of cough and shortness of breath that started the previous day. He has used his inhaler without relief.

## 2022-03-29 DIAGNOSIS — K219 Gastro-esophageal reflux disease without esophagitis: Secondary | ICD-10-CM

## 2022-03-29 DIAGNOSIS — J4531 Mild persistent asthma with (acute) exacerbation: Secondary | ICD-10-CM

## 2022-03-29 DIAGNOSIS — R7303 Prediabetes: Secondary | ICD-10-CM

## 2022-03-29 LAB — HIV ANTIBODY (ROUTINE TESTING W REFLEX): HIV Screen 4th Generation wRfx: NONREACTIVE

## 2022-03-29 LAB — HEMOGLOBIN A1C
Hgb A1c MFr Bld: 6.1 % — ABNORMAL HIGH (ref 4.8–5.6)
Mean Plasma Glucose: 128.37 mg/dL

## 2022-03-29 MED ORDER — QVAR REDIHALER 80 MCG/ACT IN AERB: 1.0000 | INHALATION_SPRAY | Freq: Two times a day (BID) | RESPIRATORY_TRACT | 2 refills | Status: DC

## 2022-03-29 MED ORDER — ALBUTEROL SULFATE (2.5 MG/3ML) 0.083% IN NEBU: 2.5000 mg | INHALATION_SOLUTION | Freq: Four times a day (QID) | RESPIRATORY_TRACT | 1 refills | Status: DC | PRN

## 2022-03-29 MED ORDER — OMEPRAZOLE MAGNESIUM 20 MG PO TBEC: 40.0000 mg | DELAYED_RELEASE_TABLET | Freq: Every day | ORAL | 2 refills | Status: DC

## 2022-03-29 MED ORDER — LOSARTAN POTASSIUM 25 MG PO TABS: 25.0000 mg | ORAL_TABLET | Freq: Every day | ORAL | 2 refills | Status: DC

## 2022-03-29 MED ORDER — POTASSIUM CHLORIDE CRYS ER 20 MEQ PO TBCR: 20.0000 meq | EXTENDED_RELEASE_TABLET | Freq: Two times a day (BID) | ORAL | 0 refills | Status: DC

## 2022-03-29 MED ORDER — PREDNISONE 20 MG PO TABS: ORAL_TABLET | ORAL | 0 refills | Status: DC

## 2022-03-29 MED ORDER — METFORMIN HCL ER 500 MG PO TB24: 500.0000 mg | ORAL_TABLET | Freq: Every day | ORAL | 2 refills | Status: DC

## 2022-03-29 MED ORDER — ALBUTEROL SULFATE HFA 108 (90 BASE) MCG/ACT IN AERS: 1.0000 | INHALATION_SPRAY | Freq: Four times a day (QID) | RESPIRATORY_TRACT | 2 refills | Status: DC | PRN

## 2022-03-29 NOTE — Progress Notes (Signed)
Nsg Discharge Note  Admit Date:  03/28/2022 Discharge date: 03/29/2022   [name] Fasig to be D/C'd Home per MD order.  AVS completed.  Copy for chart, and copy for patient signed, and dated. Patient/caregiver able to verbalize understanding.  Discharge Medication: Allergies as of 03/29/2022       Reactions   Bee Venom Swelling        Medication List     STOP taking these medications    DELSYM PO       TAKE these medications    acetaminophen 500 MG tablet Commonly known as: TYLENOL Take 1,000 mg by mouth every 6 (six) hours as needed.   albuterol (2.5 MG/3ML) 0.083% nebulizer solution Commonly known as: PROVENTIL Take 3 mLs (2.5 mg total) by nebulization every 6 (six) hours as needed for wheezing or shortness of breath (if inhaler unable to control symptoms (do not use both)). What changed: reasons to take this   albuterol 108 (90 Base) MCG/ACT inhaler Commonly known as: VENTOLIN HFA Inhale 1-2 puffs into the lungs every 6 (six) hours as needed for wheezing or shortness of breath. What changed: Another medication with the same name was changed. Make sure you understand how and when to take each.   losartan 25 MG tablet Commonly known as: COZAAR Take 1 tablet (25 mg total) by mouth daily. Start taking on: March 30, 2022   metFORMIN 500 MG 24 hr tablet Commonly known as: GLUCOPHAGE-XR Take 1 tablet (500 mg total) by mouth daily with breakfast.   omeprazole 20 MG tablet Commonly known as: PriLOSEC OTC Take 2 tablets (40 mg total) by mouth daily.   potassium chloride SA 20 MEQ tablet Commonly known as: KLOR-CON M Take 1 tablet (20 mEq total) by mouth 2 (two) times daily for 3 days.   predniSONE 20 MG tablet Commonly known as: DELTASONE Take 3 tablets by mouth daily x2 days; then 2 tablets by mouth daily x2 days; then 1 tablet by mouth daily x3 days; then half tablet by mouth daily x3 days and stop prednisone. Start taking on: March 30, 2022 What changed:  how  much to take how to take this when to take this additional instructions   Qvar RediHaler 80 MCG/ACT inhaler Generic drug: beclomethasone Inhale 1 puff into the lungs 2 (two) times daily.        Discharge Assessment: Vitals:   03/29/22 0411 03/29/22 1236  BP: 139/84 (!) 108/56  Pulse: 78 81  Resp: 19 20  Temp: 98.3 F (36.8 C) 97.8 F (36.6 C)  SpO2: 93% 91%   Skin clean, dry and intact without evidence of skin break down, no evidence of skin tears noted. IV catheter discontinued intact. Site without signs and symptoms of complications - no redness or edema noted at insertion site, patient denies c/o pain - only slight tenderness at site.  Dressing with slight pressure applied.  D/c Instructions-Education: Discharge instructions given to patient/family with verbalized understanding. D/c education completed with patient/family including follow up instructions, medication list, d/c activities limitations if indicated, with other d/c instructions as indicated by MD - patient able to verbalize understanding, all questions fully answered. Patient instructed to return to ED, call 911, or call MD for any changes in condition.  Patient escorted via WC, and D/C home via private auto.  Demetrio Lapping, LPN 04/24/9029 0:92 PM

## 2022-03-29 NOTE — TOC Transition Note (Signed)
Transition of Care [facility]) - CM/SW Discharge Note   Patient Details  Name: Delno Blaisdell MRN: [id number] Date of Birth: [birth year]  Transition [facility] Care [facility] CM/SW Contact:  Leitha Bleak, RN Phone Number: 03/29/2022, 12:41 PM   Clinical Narrative:   Patient admitted with  asthma exacerbation. Patient has no PCP and no Insurance.  TOC at the bedside, patient is agreeable to [facility]. Referral made, Care Connect card given to patient.    Final next level of care: Home/Self Care Barriers to Discharge: Barriers Resolved   Patient Goals and CMS Choice   CMS Medicare.gov Compare Post Acute Care list provided to:: Patient   Discharge Placement                Patient and family notified of of transfer: 03/29/22  Discharge Plan and Services               [facility]

## 2022-03-29 NOTE — Discharge Summary (Signed)
Physician Discharge Summary   Patient: Steve Livingston MRN: [id number] DOB: Aug 27, 1995  Admit date:     03/28/2022  Discharge date: 03/29/22  Discharge Physician: Vassie Loll   PCP: Patient, No Pcp Per   Recommendations at discharge:  Repeat basic metabolic panel to follow electrolytes and renal function Assist patient with weight management Reassess blood pressure and adjust antihypertensive treatment as needed Outpatient follow-up with pulmonologist for PFTs and further adjustment into his asthma maintenance therapy recommended.  Discharge Diagnoses: Principal Problem:   Asthma exacerbation Active Problems:   Hypertension   Family history of colon cancer in father   Pre-diabetes   Gastroesophageal reflux disease   Brief Hospital admission Course: As per H&P written by Dr. Debby Livingston on 03/28/2022 Steve Livingston is a 26 y/o man with h/o seasonal asthma and hypertension. He was in his usual health until recently when he developed increased wheezing and SOB, typical for summer time exacerbation of his asthma. HIs last flare was in April '23. He self-medicated with albuterol MDI which was ineffective. He presented to [facility] for evaluation and treatment.     ED Course: Afebrile, BP 189/90  HR 107 RR 10. He was noted to be wheezing and SOB. O2 sats were dropping into the 80'2. He was administered a duoneb treatment but symptoms persisted. He was then given a 1 hr albuterol treatment and IV solumedrol. Due to persistent wheezing and hypoxemia TRH was asked to admit on observation for continued treatmnet.   Assessment and Plan: * Asthma exacerbation -Patient with mild persistent asthma  -Previously seasonal events; has become more frequent and lacking to respond to the use of just albuterol bronchodilator as needed.   -Excellent response to the use of his steroids and hospital nebulizer treatments.   -No requiring oxygen supplementation at discharge. -Resewn as needed albuterol and started on Qvar  for maintenance therapy.   -Outpatient follow-up with pulmonologist recommended. -Patient will complete outpatient steroids tapering process.  Hypertension -Elevated blood pressure appreciated; patient not taking antihypertensive agents prior to admission. -Has been started on losartan daily (25 mg) -Advised to lose weight and to follow heart healthy diet. -Patient will need reassessment of blood pressure with further adjustment to medications as required.  Family history of colon cancer in father -Father diagnosed at 11 y/o with colon cancer -We will recommend outpatient follow-up with gastroenterology service for a screening colonoscopy starting at 37.  Gastroesophageal reflux disease - Started on PPI. -Lifestyle changes modification discussed with patient.  Pre-diabetes - A1c 6.1 -Patient also with hypertension and morbid obesity -Will start low-dose extended release metformin -Close monitoring of CBGs/A1c with further adjustment as required. -High risk to progress into full diabetic without early intervention. -Patient advised to lose weight.  Hypokalemia: -Electrolytes repleted -Repeat basic metabolic panel to follow trend/stability.  Morbid obesity -Body mass index is 48.43 kg/m. -Increased physical activity, low calorie diet and portion control discussed with patient.  Consultants: None Procedures performed: See below for x-ray reports. Disposition: Home Diet recommendation: Low calorie, heart healthy and modified carbohydrate diet recommended.  DISCHARGE MEDICATION: Allergies as of 03/29/2022       Reactions   Bee Venom Swelling        Medication List     STOP taking these medications    DELSYM PO       TAKE these medications    acetaminophen 500 MG tablet Commonly known as: TYLENOL Take 1,000 mg by mouth every 6 (six) hours as needed.   albuterol (2.5 MG/3ML) 0.083%  nebulizer solution Commonly known as: PROVENTIL Take 3 mLs (2.5 mg total) by  nebulization every 6 (six) hours as needed for wheezing or shortness of breath (if inhaler unable to control symptoms (do not use both)). What changed: reasons to take this   albuterol 108 (90 Base) MCG/ACT inhaler Commonly known as: VENTOLIN HFA Inhale 1-2 puffs into the lungs every 6 (six) hours as needed for wheezing or shortness of breath. What changed: Another medication with the same name was changed. Make sure you understand how and when to take each.   losartan 25 MG tablet Commonly known as: COZAAR Take 1 tablet (25 mg total) by mouth daily. Start taking on: March 30, 2022   metFORMIN 500 MG 24 hr tablet Commonly known as: GLUCOPHAGE-XR Take 1 tablet (500 mg total) by mouth daily with breakfast.   omeprazole 20 MG tablet Commonly known as: PriLOSEC OTC Take 2 tablets (40 mg total) by mouth daily.   potassium chloride SA 20 MEQ tablet Commonly known as: KLOR-CON M Take 1 tablet (20 mEq total) by mouth 2 (two) times daily for 3 days.   predniSONE 20 MG tablet Commonly known as: DELTASONE Take 3 tablets by mouth daily x2 days; then 2 tablets by mouth daily x2 days; then 1 tablet by mouth daily x3 days; then half tablet by mouth daily x3 days and stop prednisone. Start taking on: March 30, 2022 What changed:  how much to take how to take this when to take this additional instructions   Qvar RediHaler 80 MCG/ACT inhaler Generic drug: beclomethasone Inhale 1 puff into the lungs 2 (two) times daily.        Follow-up Information     [facility]. Call.   Contact information: [phone]               Discharge Exam: Ceasar Mons Weights   03/28/22 1103 03/28/22 2249  Weight: 133.8 kg (!) 162 kg   General exam: Alert, awake, oriented x 3; bili obesity on examination; speaking in full sentences, no requiring oxygen supplementation and expressing significant improvement in his breathing. Respiratory system: Very mild expiratory wheezing at the end of expiration  appreciated on exam; no using accessory muscles, no tachypnea, good saturation on room air. Cardiovascular system:RRR. No murmurs, rubs, gallops.  Unable to properly assess JVD with body habitus. Gastrointestinal system: Abdomen is obese, nondistended, soft and nontender. No organomegaly or masses felt. Normal bowel sounds heard. Central nervous system: Alert and oriented. No focal neurological deficits. Extremities: No cyanosis or clubbing. Skin: No petechiae. Psychiatry: Judgement and insight appear normal. Mood & affect appropriate.    Condition at discharge: Stable and improved.  The results of significant diagnostics from this hospitalization (including imaging, microbiology, ancillary and laboratory) are listed below for reference.   Imaging Studies: DG Chest 2 View  Result Date: 03/28/2022 CLINICAL DATA:  Cough for 2 days. EXAM: CHEST - 2 VIEW COMPARISON:  Chest radiograph April 1923 FINDINGS: The heart size and mediastinal contours are within normal limits. Both lungs are clear. The visualized skeletal structures are unremarkable. IMPRESSION: No active cardiopulmonary disease. Electronically Signed   By: Annia Belt M.D.   On: 03/28/2022 11:39    Microbiology: Results for orders placed or performed during the hospital encounter of 03/28/22  Resp Panel by RT-PCR (Flu A&B, Covid) Anterior Nasal Swab     Status: None   Collection Time: 03/28/22 11:58 AM   Specimen: Anterior Nasal Swab  Result Value Ref Range Status   SARS Coronavirus  2 by RT PCR NEGATIVE NEGATIVE Final    Comment: (NOTE) SARS-CoV-2 target nucleic acids are NOT DETECTED.  The SARS-CoV-2 RNA is generally detectable in upper respiratory specimens during the acute phase of infection. The lowest concentration of SARS-CoV-2 viral copies this assay can detect is 138 copies/mL. A negative result does not preclude SARS-Cov-2 infection and should not be used as the sole basis for treatment or other patient management  decisions. A negative result may occur with  improper specimen collection/handling, submission of specimen other than nasopharyngeal swab, presence of viral mutation(s) within the areas targeted by this assay, and inadequate number of viral copies(<138 copies/mL). A negative result must be combined with clinical observations, patient history, and epidemiological information. The expected result is Negative.  Fact Sheet for Patients:  BloggerCourse.com  Fact Sheet for Healthcare Providers:  SeriousBroker.it  This test is no t yet approved or cleared by the [place] FDA and  has been authorized for detection and/or diagnosis of SARS-CoV-2 by FDA under an Emergency Use Authorization (EUA). This EUA will remain  in effect (meaning this test can be used) for the duration of the COVID-19 declaration under Section 564(b)(1) of the Act, 21 U.S.C.section 360bbb-3(b)(1), unless the authorization is terminated  or revoked sooner.       Influenza A by PCR NEGATIVE NEGATIVE Final   Influenza B by PCR NEGATIVE NEGATIVE Final    Comment: (NOTE) The Xpert Xpress SARS-CoV-2/FLU/RSV plus assay is intended as an aid in the diagnosis of influenza from Nasopharyngeal swab specimens and should not be used as a sole basis for treatment. Nasal washings and aspirates are unacceptable for Xpert Xpress SARS-CoV-2/FLU/RSV testing.  Fact Sheet for Patients: BloggerCourse.com  Fact Sheet for Healthcare Providers: SeriousBroker.it  This test is not yet approved or cleared by the [place] FDA and has been authorized for detection and/or diagnosis of SARS-CoV-2 by FDA under an Emergency Use Authorization (EUA). This EUA will remain in effect (meaning this test can be used) for the duration of the COVID-19 declaration under Section 564(b)(1) of the Act, 21 U.S.C. section 360bbb-3(b)(1), unless the  authorization is terminated or revoked.  Performed at [facility], [address] [place], [place] [address]     Labs: CBC: Recent Labs  Lab 03/28/22 1657  WBC 8.8  NEUTROABS 3.4  HGB 11.1*  HCT 35.5*  MCV 87.7  PLT 207   Basic Metabolic Panel: Recent Labs  Lab 03/28/22 1657  NA 135  K 2.9*  CL 105  CO2 27  GLUCOSE 104*  BUN 5*  CREATININE 0.83  CALCIUM 8.1*    Discharge time spent: greater than 30 minutes.  Signed: Vassie Loll, MD [facility] 03/29/2022

## 2022-03-29 NOTE — Progress Notes (Signed)
Ok to be without IV access per Dr. Debby Bud

## 2022-03-29 NOTE — Assessment & Plan Note (Signed)
-   Started on PPI. -Lifestyle changes modification discussed with patient.

## 2022-03-29 NOTE — Assessment & Plan Note (Signed)
-   A1c 6.1 -Patient also with hypertension and morbid obesity -Will start low-dose extended release metformin -Close monitoring of CBGs/A1c with further adjustment as required. -High risk to progress into full diabetic without early intervention. -Patient advised to lose weight.

## 2022-03-31 ENCOUNTER — Telehealth: Payer: Self-pay

## 2022-03-31 NOTE — Telephone Encounter (Signed)
pt returned call and a new enrollment appt was scheduled  for Wed, 8.16.23 at 1:30pm for the [facility] Connect uninsured program  Interview was conducted to ensure pt qualified for program and pt successfully qualified  All documents were reviewed on what to bring to appt and pt stated he would be able to bring to appt.  Pt was also sent aText message with reminder appt information  Call ended.

## 2022-03-31 NOTE — Telephone Encounter (Signed)
Referral was received from Lurena Joiner, Social Worker @ [facility]  An first atttempt to follow up with patient on today was made by phone call in addition to sending text message requesting the pt to follow back up with making an appointment to complete enrollment  with the Care Connect uninsured program of [place].     Plan - contact information to return call and to make appointment was left within the text message

## 2022-04-06 NOTE — Congregational Nurse Program (Signed)
  Late Entry Per Visit on 2.14.23   Pt attended [facility]  to complete enrollment into Connect program in addition to becoming established with a new primary care provider.  Pt last seen seen at ER on this month. States he had a provider but is wanting a change    Chief Reasons Needed for  PCP   Past /Current Medical History States Hx of hypertensionwith meds, pre-diabetes with meds, asthma with meds   -Pt educated on BP and Diabetes, Asthma management, signs and symptoms to address to seek emergency medical treatment. And the  importance of  maintaining connection with his provider and keeping up with his medication refills   -Referral sent to RN Nurse Case Manager, Norval Gable, for initial and continous medical case management upon completion of first medical appointment   Socio-determinants health needs identified: -On-going Medication Assistance resource needed     Plan -  Enrollment completed Care Connect Program.  - Care Connect Eligibile (8..16./23 thru 8.16. 24) - MedAssist Application Initiated  and emailed     (pending approval) Teaching laboratory technician  was expedited  -Scheduled Appointment for first medical visit was scheduled for Wed, August 23 @ 1:00p at the [facility] [place].

## 2022-04-13 ENCOUNTER — Ambulatory Visit: Payer: Self-pay | Admitting: Physician Assistant

## 2022-04-13 ENCOUNTER — Encounter: Payer: Self-pay | Admitting: Physician Assistant

## 2022-04-13 VITALS — BP 114/68 | HR 92 | Temp 97.8°F | Ht 72.0 in | Wt 348.0 lb

## 2022-04-13 DIAGNOSIS — E876 Hypokalemia: Secondary | ICD-10-CM

## 2022-04-13 DIAGNOSIS — R7303 Prediabetes: Secondary | ICD-10-CM

## 2022-04-13 DIAGNOSIS — J45909 Unspecified asthma, uncomplicated: Secondary | ICD-10-CM

## 2022-04-13 DIAGNOSIS — Z7689 Persons encountering health services in other specified circumstances: Secondary | ICD-10-CM

## 2022-04-13 DIAGNOSIS — Z13220 Encounter for screening for lipoid disorders: Secondary | ICD-10-CM

## 2022-04-13 DIAGNOSIS — I10 Essential (primary) hypertension: Secondary | ICD-10-CM

## 2022-04-13 MED ORDER — FLUTICASONE-SALMETEROL 100-50 MCG/ACT IN AEPB: 1.0000 | INHALATION_SPRAY | Freq: Two times a day (BID) | RESPIRATORY_TRACT | 0 refills | Status: DC

## 2022-04-13 NOTE — Progress Notes (Unsigned)
BP 114/68   Pulse 92   Temp 97.8 F (36.6 C)   Ht 6' (1.829 m)   Wt (!) 348 lb (157.9 kg)   SpO2 98%   BMI 47.20 kg/m    Subjective:    Patient ID: Steve Livingston, male    DOB: 04/19/1996, 26 y.o.   MRN: [id number]  HPI: Steve Livingston is a 26 y.o. male presenting on 04/13/2022 for New Patient (Initial Visit) (Pt has not had a PCP since his pediatrics. Pt went to Er due to asthma, pt was then given K+ rx and medication for DM (pt unsure the name).)   HPI  Chief Complaint  Patient presents with   New Patient (Initial Visit)    Pt has not had a PCP since his pediatrics. Pt went to Er due to asthma, pt was then given K+ rx and medication for DM (pt unsure the name).     Pt is 25yoM who presents to establish care.  He was inpatient earlier this month for asthma exacerbation.   Pt has not had a PCP in a long time.  He was just started on metformin during recent hospitalization for pre-diabetes.   He Lives with fiance and 2yo son.  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 He does have wheezing and sob 38mo/year.    He exercises - does power lifting 3day/week.    He got covid vaccination  Pt on keto diet now.     Relevant past medical, surgical, family and social history reviewed and updated as indicated. Interim medical history since our last visit reviewed. Allergies and medications reviewed and updated.   Current Outpatient Medications:    acetaminophen (TYLENOL) 500 MG tablet, Take 1,000 mg by mouth every 6 (six) hours as needed., Disp: , Rfl:    albuterol (PROVENTIL) (2.5 MG/3ML) 0.083% nebulizer solution, Take 3 mLs (2.5 mg total) by nebulization every 6 (six) hours as needed for wheezing or shortness of breath (if inhaler unable to control symptoms (do not use both))., Disp: 75 mL, Rfl: 1   albuterol (VENTOLIN HFA) 108 (90 Base) MCG/ACT inhaler, Inhale 1-2 puffs into the lungs  every 6 (six) hours as needed for wheezing or shortness of breath., Disp: 18 g, Rfl: 2   losartan (COZAAR) 25 MG tablet, Take 1 tablet (25 mg total) by mouth daily., Disp: 30 tablet, Rfl: 2   metFORMIN (GLUCOPHAGE-XR) 500 MG 24 hr tablet, Take 1 tablet (500 mg total) by mouth daily with breakfast., Disp: 30 tablet, Rfl: 2   potassium chloride SA (KLOR-CON M) 20 MEQ tablet, Take 1 tablet (20 mEq total) by mouth 2 (two) times daily for 3 days., Disp: 6 tablet, Rfl: 0   predniSONE (DELTASONE) 20 MG tablet, Take 3 tablets by mouth daily x2 days; then 2 tablets by mouth daily x2 days; then 1 tablet by mouth daily x3 days; then half tablet by mouth daily x3 days and stop prednisone., Disp: 15 tablet, Rfl: 0   beclomethasone (QVAR REDIHALER) 80 MCG/ACT inhaler, Inhale 1 puff into the lungs 2 (two) times daily. (Patient not taking: Reported on 04/13/2022), Disp: 1 each, Rfl: 2    Review of Systems  Per HPI unless specifically indicated above     Objective:    BP 114/68   Pulse 92   Temp 97.8 F (36.6 C)   Ht 6' (1.829 m)   Wt (!) 348  lb (157.9 kg)   SpO2 98%   BMI 47.20 kg/m   Wt Readings from Last 3 Encounters:  04/13/22 (!) 348 lb (157.9 kg)  03/28/22 (!) 357 lb 1.6 oz (162 kg)  12/08/21 298 lb (135.2 kg)    Physical Exam Vitals reviewed.  Constitutional:      General: He is not in acute distress.    Appearance: He is well-developed. He is obese. He is not ill-appearing or toxic-appearing.  HENT:     Head: Normocephalic and atraumatic.     Right Ear: Tympanic membrane, ear canal and external ear normal.     Left Ear: Tympanic membrane, ear canal and external ear normal.  Eyes:     Extraocular Movements: Extraocular movements intact.     Conjunctiva/sclera: Conjunctivae normal.     Pupils: Pupils are equal, round, and reactive to light.  Neck:     Thyroid: No thyromegaly.  Cardiovascular:     Rate and Rhythm: Normal rate and regular rhythm.  Pulmonary:     Effort: Pulmonary  effort is normal.     Breath sounds: Normal breath sounds. No wheezing or rales.  Abdominal:     General: Bowel sounds are normal.     Palpations: Abdomen is soft. There is no mass.     Tenderness: There is no abdominal tenderness.  Musculoskeletal:     Cervical back: Neck supple.     Right lower leg: No edema.     Left lower leg: No edema.  Lymphadenopathy:     Cervical: No cervical adenopathy.  Skin:    General: Skin is warm and dry.     Findings: No rash.  Neurological:     Mental Status: He is alert and oriented to person, place, and time.     Motor: No weakness or tremor.     Gait: Gait is intact.     Deep Tendon Reflexes:     Reflex Scores:      Patellar reflexes are 2+ on the right side and 2+ on the left side. Psychiatric:        Attention and Perception: Attention normal.        Mood and Affect: Mood normal. Affect is not inappropriate.        Speech: Speech normal.        Behavior: Behavior normal. Behavior is cooperative.     Comments: Pleasant and engaged.  Smiles often and appears happy.             Assessment & Plan:   Encounter Diagnoses  Name Primary?   Encounter to establish care Yes   Uncomplicated asthma, unspecified asthma severity, unspecified whether persistent    Pre-diabetes    Morbid obesity (HCC)    Hypokalemia    Primary hypertension    Screening cholesterol level     -pt was counseled on prediabetes -Refer to RD for assistance with weight and dietary -reCheck K+ -Check Cholesterol -will send rx to [facility]- advair, albuterol mdi, losartan.  Pt is given sample advair to last him until supply comes from [facility].  He has solution for his nebulizer -pt to follow up 1 month.  He is to contact office sooner prn

## 2022-04-14 ENCOUNTER — Telehealth: Payer: Self-pay

## 2022-04-14 NOTE — Telephone Encounter (Signed)
Client returned call, states all went well at his appointment. Reviewed his next appointment and that he has labs to get drawn prior to his next appointment. He has his medications  for now. He will be referred to RD for prediabetes diet education. Discussed we are awaiting his [facility] application to be approved and once approved his provider will know and some of his medications will be available with [facility] in the mail.  He has no other needs at this time. Did review the [facility] at [facility] and how often he can access this resource and the hours.   Will continue to follow as needed.   Francee Nodal RN [employer]

## 2022-04-14 NOTE — Telephone Encounter (Signed)
Attempted call to follow up with client regarding recent appointment [facility].   No answer, left voicemail requesting return call.  Francee Nodal RN [employer]

## 2022-05-18 ENCOUNTER — Ambulatory Visit: Payer: Self-pay | Admitting: Physician Assistant

## 2022-05-18 ENCOUNTER — Encounter: Payer: Self-pay | Admitting: Physician Assistant

## 2022-05-18 ENCOUNTER — Telehealth: Payer: Self-pay | Admitting: Physician Assistant

## 2022-05-18 ENCOUNTER — Other Ambulatory Visit (HOSPITAL_COMMUNITY)
Admission: RE | Admit: 2022-05-18 | Discharge: 2022-05-18 | Disposition: A | Discharge status: Home / Self Care | Payer: Self-pay | Source: Ambulatory Visit | Attending: Physician Assistant | Admitting: Physician Assistant

## 2022-05-18 VITALS — BP 108/70 | HR 80 | Temp 97.8°F | Ht 72.0 in | Wt 364.0 lb

## 2022-05-18 DIAGNOSIS — E876 Hypokalemia: Secondary | ICD-10-CM

## 2022-05-18 DIAGNOSIS — Z6841 Body Mass Index (BMI) 40.0 and over, adult: Secondary | ICD-10-CM

## 2022-05-18 DIAGNOSIS — R7303 Prediabetes: Secondary | ICD-10-CM

## 2022-05-18 DIAGNOSIS — I10 Essential (primary) hypertension: Secondary | ICD-10-CM

## 2022-05-18 DIAGNOSIS — J45909 Unspecified asthma, uncomplicated: Secondary | ICD-10-CM

## 2022-05-18 DIAGNOSIS — Z13220 Encounter for screening for lipoid disorders: Secondary | ICD-10-CM

## 2022-05-18 DIAGNOSIS — R011 Cardiac murmur, unspecified: Secondary | ICD-10-CM

## 2022-05-18 LAB — COMPREHENSIVE METABOLIC PANEL
ALT: 18 U/L (ref 0–44)
AST: 24 U/L (ref 15–41)
Albumin: 4.2 g/dL (ref 3.5–5.0)
Alkaline Phosphatase: 57 U/L (ref 38–126)
Anion gap: 7 (ref 5–15)
BUN: 9 mg/dL (ref 6–20)
CO2: 26 mmol/L (ref 22–32)
Calcium: 9.3 mg/dL (ref 8.9–10.3)
Chloride: 106 mmol/L (ref 98–111)
Creatinine, Ser: 0.74 mg/dL (ref 0.61–1.24)
GFR, Estimated: >60 mL/min (ref >60)
Glucose, Bld: 93 mg/dL (ref 70–99)
Potassium: 4.6 mmol/L (ref 3.5–5.1)
Sodium: 139 mmol/L (ref 135–145)
Total Bilirubin: 1 mg/dL (ref 0.3–1.2)
Total Protein: 8.1 g/dL (ref 6.5–8.1)

## 2022-05-18 LAB — LIPID PANEL
Cholesterol: 142 mg/dL (ref 0–200)
HDL: 44 mg/dL (ref >40)
LDL Cholesterol: 90 mg/dL (ref 0–99)
Total CHOL/HDL Ratio: 3.2 RATIO
Triglycerides: 42 mg/dL (ref <150)
VLDL: 8 mg/dL (ref 0–40)

## 2022-05-18 NOTE — Telephone Encounter (Signed)
Pt was called and notified of echo appt Tuesday 05/24/22 at 1:00pm at [facility]

## 2022-05-18 NOTE — Progress Notes (Signed)
BP 108/70   Pulse 80   Temp 97.8 F (36.6 C)   Ht 6' (1.829 m)   Wt (!) 364 lb (165.1 kg)   SpO2 97%   BMI 49.37 kg/m    Subjective:    Patient ID: Steve Livingston, male    DOB: [birth year], 26 y.o.   MRN: [id number]  HPI: Steve Livingston is a 26 y.o. male presenting on 05/18/2022 for Asthma   HPI  Chief Complaint  Patient presents with   Asthma    Pt says his breathing is improved a great deal since starting on the advair inhaler.   He  just got labs drawn before coming in today.   He has been out of metformin and losartan for about 3 week.   Relevant past medical, surgical, family and social history reviewed and updated as indicated. Interim medical history since our last visit reviewed. Allergies and medications reviewed and updated.    Current Outpatient Medications:    albuterol (PROVENTIL) (2.5 MG/3ML) 0.083% nebulizer solution, Take 3 mLs (2.5 mg total) by nebulization every 6 (six) hours as needed for wheezing or shortness of breath (if inhaler unable to control symptoms (do not use both))., Disp: 75 mL, Rfl: 1   albuterol (VENTOLIN HFA) 108 (90 Base) MCG/ACT inhaler, Inhale 1-2 puffs into the lungs every 6 (six) hours as needed for wheezing or shortness of breath., Disp: 18 g, Rfl: 2   fluticasone-salmeterol (ADVAIR) 100-50 MCG/ACT AEPB, Inhale 1 puff into the lungs 2 (two) times daily., Disp: 1 each, Rfl: 0   losartan (COZAAR) 25 MG tablet, Take 1 tablet (25 mg total) by mouth daily. (Patient not taking: Reported on 05/18/2022), Disp: 30 tablet, Rfl: 2   metFORMIN (GLUCOPHAGE-XR) 500 MG 24 hr tablet, Take 1 tablet (500 mg total) by mouth daily with breakfast. (Patient not taking: Reported on 05/18/2022), Disp: 30 tablet, Rfl: 2    Review of Systems  Per HPI unless specifically indicated above     Objective:    BP 108/70   Pulse 80   Temp 97.8 F (36.6 C)   Ht 6' (1.829 m)   Wt (!) 364 lb (165.1 kg)   SpO2 97%   BMI 49.37 kg/m   Wt Readings from Last 3  Encounters:  05/18/22 (!) 364 lb (165.1 kg)  04/13/22 (!) 348 lb (157.9 kg)  03/28/22 (!) 357 lb 1.6 oz (162 kg)    Physical Exam Vitals reviewed.  Constitutional:      General: He is not in acute distress.    Appearance: He is well-developed. He is obese. He is not ill-appearing.  HENT:     Head: Normocephalic and atraumatic.  Cardiovascular:     Rate and Rhythm: Normal rate and regular rhythm.     Comments: Abnormal heart sounds- split v murmur Pulmonary:     Effort: Pulmonary effort is normal. No respiratory distress.     Breath sounds: Normal breath sounds. No wheezing.  Abdominal:     General: Bowel sounds are normal.     Palpations: Abdomen is soft.     Tenderness: There is no abdominal tenderness.  Musculoskeletal:     Cervical back: Neck supple.  Lymphadenopathy:     Cervical: No cervical adenopathy.  Skin:    General: Skin is warm and dry.  Neurological:     Mental Status: He is alert and oriented to person, place, and time.  Psychiatric:        Behavior: Behavior normal.  Assessment & Plan:    Encounter Diagnoses  Name Primary?   Uncomplicated asthma, unspecified asthma severity, unspecified whether persistent Yes   Murmur, cardiac    Class 3 severe obesity with body mass index (BMI) of 45.0 to 49.9 in adult, unspecified obesity type, unspecified whether serious comorbidity present ([place])      -bp great.  D/c losartan -labs pending.  He will be called with results -will obtain Echo to evaluate murmur.  Ekg 03/28/22 unremarkable -He has [facility] financial assistance -pt to follow up 3 months.  He is to contact office sooner prn

## 2022-05-24 ENCOUNTER — Ambulatory Visit (HOSPITAL_COMMUNITY): Admission: RE | Admit: 2022-05-24 | Payer: Self-pay | Source: Ambulatory Visit

## 2022-06-14 ENCOUNTER — Ambulatory Visit: Payer: Self-pay | Admitting: Nutrition

## 2022-06-20 ENCOUNTER — Ambulatory Visit (HOSPITAL_COMMUNITY): Admission: RE | Admit: 2022-06-20 | Payer: Self-pay | Source: Ambulatory Visit

## 2022-06-22 ENCOUNTER — Ambulatory Visit (HOSPITAL_COMMUNITY)
Admission: RE | Admit: 2022-06-22 | Discharge: 2022-06-22 | Disposition: A | Discharge status: Home / Self Care | Payer: Self-pay | Source: Ambulatory Visit | Attending: Physician Assistant | Admitting: Physician Assistant

## 2022-06-22 DIAGNOSIS — R011 Cardiac murmur, unspecified: Secondary | ICD-10-CM | POA: Insufficient documentation

## 2022-06-22 LAB — ECHOCARDIOGRAM COMPLETE
Area-P 1/2: 2.16 cm2
S' Lateral: 3 cm

## 2022-06-22 NOTE — Progress Notes (Signed)
*  PRELIMINARY RESULTS* Echocardiogram 2D Echocardiogram has been performed.  Steve Livingston 06/22/2022, 9:42 AM

## 2022-07-27 ENCOUNTER — Ambulatory Visit: Payer: Self-pay | Admitting: Physician Assistant

## 2022-08-01 ENCOUNTER — Ambulatory Visit: Payer: Self-pay | Admitting: Nutrition

## 2022-08-10 ENCOUNTER — Other Ambulatory Visit: Payer: Self-pay | Admitting: Physician Assistant

## 2022-08-10 ENCOUNTER — Telehealth: Payer: Self-pay | Admitting: Physician Assistant

## 2022-08-10 DIAGNOSIS — Z8639 Personal history of other endocrine, nutritional and metabolic disease: Secondary | ICD-10-CM

## 2022-08-10 DIAGNOSIS — Z8679 Personal history of other diseases of the circulatory system: Secondary | ICD-10-CM

## 2022-08-10 NOTE — Telephone Encounter (Signed)
Attempted to contact pt to r/s him to come in for appt.  No answer.  Left VM asking pt to call office

## 2022-08-18 ENCOUNTER — Inpatient Hospital Stay (HOSPITAL_COMMUNITY): Payer: Medicaid Other

## 2022-08-18 ENCOUNTER — Emergency Department (HOSPITAL_COMMUNITY): Payer: Medicaid Other

## 2022-08-18 ENCOUNTER — Inpatient Hospital Stay (HOSPITAL_COMMUNITY)
Admission: EM | Admit: 2022-08-18 | Discharge: 2022-08-20 | DRG: 395 | Disposition: A | Discharge status: Home / Self Care | Payer: Medicaid Other | Attending: General Surgery | Admitting: General Surgery

## 2022-08-18 ENCOUNTER — Encounter (HOSPITAL_COMMUNITY): Payer: Self-pay

## 2022-08-18 ENCOUNTER — Other Ambulatory Visit: Payer: Self-pay

## 2022-08-18 DIAGNOSIS — Z7951 Long term (current) use of inhaled steroids: Secondary | ICD-10-CM | POA: Diagnosis not present

## 2022-08-18 DIAGNOSIS — Z91030 Bee allergy status: Secondary | ICD-10-CM | POA: Diagnosis not present

## 2022-08-18 DIAGNOSIS — Z833 Family history of diabetes mellitus: Secondary | ICD-10-CM | POA: Diagnosis not present

## 2022-08-18 DIAGNOSIS — R7303 Prediabetes: Secondary | ICD-10-CM | POA: Diagnosis present

## 2022-08-18 DIAGNOSIS — Z87891 Personal history of nicotine dependence: Secondary | ICD-10-CM

## 2022-08-18 DIAGNOSIS — J45909 Unspecified asthma, uncomplicated: Secondary | ICD-10-CM | POA: Diagnosis present

## 2022-08-18 DIAGNOSIS — Z8249 Family history of ischemic heart disease and other diseases of the circulatory system: Secondary | ICD-10-CM | POA: Diagnosis not present

## 2022-08-18 DIAGNOSIS — Z9079 Acquired absence of other genital organ(s): Secondary | ICD-10-CM

## 2022-08-18 DIAGNOSIS — Z20822 Contact with and (suspected) exposure to covid-19: Secondary | ICD-10-CM | POA: Diagnosis present

## 2022-08-18 DIAGNOSIS — R1032 Left lower quadrant pain: Secondary | ICD-10-CM | POA: Diagnosis present

## 2022-08-18 DIAGNOSIS — I10 Essential (primary) hypertension: Secondary | ICD-10-CM | POA: Diagnosis present

## 2022-08-18 DIAGNOSIS — K430 Incisional hernia with obstruction, without gangrene: Principal | ICD-10-CM | POA: Diagnosis present

## 2022-08-18 DIAGNOSIS — K56609 Unspecified intestinal obstruction, unspecified as to partial versus complete obstruction: Principal | ICD-10-CM

## 2022-08-18 LAB — URINALYSIS, ROUTINE W REFLEX MICROSCOPIC
Bacteria, UA: NONE SEEN
Bilirubin Urine: NEGATIVE
Glucose, UA: NEGATIVE mg/dL
Hgb urine dipstick: NEGATIVE
Ketones, ur: 20 mg/dL — AB
Leukocytes,Ua: NEGATIVE
Nitrite: NEGATIVE
Protein, ur: 30 mg/dL — AB
Specific Gravity, Urine: 1.025 (ref 1.005–1.030)
pH: 7 (ref 5.0–8.0)

## 2022-08-18 LAB — CBC WITH DIFFERENTIAL/PLATELET
Abs Immature Granulocytes: 0.04 10*3/uL (ref 0.00–0.07)
Basophils Absolute: 0 10*3/uL (ref 0.0–0.1)
Basophils Relative: 0 %
Eosinophils Absolute: 0 10*3/uL (ref 0.0–0.5)
Eosinophils Relative: 0 %
HCT: 57.7 % — ABNORMAL HIGH (ref 39.0–52.0)
Hemoglobin: 18.7 g/dL — ABNORMAL HIGH (ref 13.0–17.0)
Immature Granulocytes: 0 %
Lymphocytes Relative: 17 %
Lymphs Abs: 2.1 10*3/uL (ref 0.7–4.0)
MCH: 27.5 pg (ref 26.0–34.0)
MCHC: 32.4 g/dL (ref 30.0–36.0)
MCV: 84.9 fL (ref 80.0–100.0)
Monocytes Absolute: 0.5 10*3/uL (ref 0.1–1.0)
Monocytes Relative: 4 %
Neutro Abs: 10.1 10*3/uL — ABNORMAL HIGH (ref 1.7–7.7)
Neutrophils Relative %: 79 %
Platelets: 167 10*3/uL (ref 150–400)
RBC: 6.8 MIL/uL — ABNORMAL HIGH (ref 4.22–5.81)
RDW: 13.7 % (ref 11.5–15.5)
WBC: 12.8 10*3/uL — ABNORMAL HIGH (ref 4.0–10.5)
nRBC: 0 % (ref 0.0–0.2)

## 2022-08-18 LAB — RESP PANEL BY RT-PCR (RSV, FLU A&B, COVID)  RVPGX2
Influenza A by PCR: NEGATIVE
Influenza B by PCR: NEGATIVE
Resp Syncytial Virus by PCR: NEGATIVE
SARS Coronavirus 2 by RT PCR: NEGATIVE

## 2022-08-18 LAB — COMPREHENSIVE METABOLIC PANEL
ALT: 21 U/L (ref 0–44)
AST: 21 U/L (ref 15–41)
Albumin: 4.5 g/dL (ref 3.5–5.0)
Alkaline Phosphatase: 60 U/L (ref 38–126)
Anion gap: 9 (ref 5–15)
BUN: 8 mg/dL (ref 6–20)
CO2: 24 mmol/L (ref 22–32)
Calcium: 9.3 mg/dL (ref 8.9–10.3)
Chloride: 104 mmol/L (ref 98–111)
Creatinine, Ser: 0.63 mg/dL (ref 0.61–1.24)
GFR, Estimated: >60 mL/min (ref >60)
Glucose, Bld: 127 mg/dL — ABNORMAL HIGH (ref 70–99)
Potassium: 4.5 mmol/L (ref 3.5–5.1)
Sodium: 137 mmol/L (ref 135–145)
Total Bilirubin: 1.3 mg/dL — ABNORMAL HIGH (ref 0.3–1.2)
Total Protein: 8.7 g/dL — ABNORMAL HIGH (ref 6.5–8.1)

## 2022-08-18 LAB — LIPASE, BLOOD: Lipase: 30 U/L (ref 11–51)

## 2022-08-18 MED ORDER — ONDANSETRON 4 MG PO TBDP: 4.0000 mg | ORAL_TABLET | Freq: Four times a day (QID) | ORAL | Status: DC | PRN

## 2022-08-18 MED ORDER — SODIUM CHLORIDE 0.9 % IV BOLUS
1000.0000 mL | Freq: Once | INTRAVENOUS | Status: AC
  Administered 2022-08-18: 1000 mL via INTRAVENOUS

## 2022-08-18 MED ORDER — ONDANSETRON HCL 4 MG/2ML IJ SOLN: 4.0000 mg | Freq: Four times a day (QID) | INTRAMUSCULAR | Status: DC | PRN

## 2022-08-18 MED ORDER — DIPHENHYDRAMINE HCL 25 MG PO CAPS: 25.0000 mg | ORAL_CAPSULE | Freq: Four times a day (QID) | ORAL | Status: DC | PRN

## 2022-08-18 MED ORDER — ALBUTEROL SULFATE (2.5 MG/3ML) 0.083% IN NEBU: 2.5000 mg | INHALATION_SOLUTION | Freq: Four times a day (QID) | RESPIRATORY_TRACT | Status: DC | PRN

## 2022-08-18 MED ORDER — ENOXAPARIN SODIUM 40 MG/0.4ML IJ SOSY
40.0000 mg | PREFILLED_SYRINGE | INTRAMUSCULAR | Status: DC
  Administered 2022-08-18 – 2022-08-20 (×3): 40 mg via SUBCUTANEOUS
  Filled 2022-08-18 (×3): qty 0.4

## 2022-08-18 MED ORDER — ALBUTEROL SULFATE HFA 108 (90 BASE) MCG/ACT IN AERS: 1.0000 | INHALATION_SPRAY | Freq: Four times a day (QID) | RESPIRATORY_TRACT | Status: DC | PRN

## 2022-08-18 MED ORDER — MOMETASONE FURO-FORMOTEROL FUM 100-5 MCG/ACT IN AERO
2.0000 | INHALATION_SPRAY | Freq: Two times a day (BID) | RESPIRATORY_TRACT | Status: DC
  Administered 2022-08-19 – 2022-08-20 (×3): 2 via RESPIRATORY_TRACT
  Filled 2022-08-18: qty 8.8

## 2022-08-18 MED ORDER — POTASSIUM CHLORIDE IN NACL 20-0.9 MEQ/L-% IV SOLN
INTRAVENOUS | Status: DC
  Filled 2022-08-18 (×4): qty 1000

## 2022-08-18 MED ORDER — HYDROMORPHONE HCL 1 MG/ML IJ SOLN: 1.0000 mg | INTRAMUSCULAR | Status: DC | PRN

## 2022-08-18 MED ORDER — IOHEXOL 300 MG/ML  SOLN
100.0000 mL | Freq: Once | INTRAMUSCULAR | Status: AC | PRN
  Administered 2022-08-18: 100 mL via INTRAVENOUS

## 2022-08-18 MED ORDER — DIPHENHYDRAMINE HCL 50 MG/ML IJ SOLN: 25.0000 mg | Freq: Four times a day (QID) | INTRAMUSCULAR | Status: DC | PRN

## 2022-08-18 MED ORDER — SODIUM CHLORIDE (PF) 0.9 % IJ SOLN
INTRAMUSCULAR | Status: AC
  Filled 2022-08-18: qty 50

## 2022-08-18 NOTE — ED Notes (Signed)
ED TO INPATIENT HANDOFF REPORT  ED Nurse Name and Phone #: Delice Bisonara, RN  S Name/Age/Gender Steve Livingston 26 y.o. male Room/Bed: RESA/RESA  Code Status   Code Status: Full Code  Home/SNF/Other Home Patient oriented to: self, place, time, and situation Is this baseline? Yes   Triage Complete: Triage complete  Chief Complaint SBO (small bowel obstruction) (HCC) [K56.609]  Triage Note Pt reports with lower abdominal pain and vomiting since yesterday.    Allergies Allergies  Allergen Reactions   Bee Venom Swelling    Level of Care/Admitting Diagnosis ED Disposition     ED Disposition  Admit   Condition  --   Comment  [facility] [[id number]]  Level of Care: Med-Surg [16]  [facility] to [facility] or [facility] if equivalent level of care is available:: No  Covid Evaluation: Confirmed COVID Negative  Diagnosis: SBO (small bowel obstruction) [facility](HCC) [[id number]]) [[id number]]  Admitting Physician: Abigail MiyamotoBLACKMAN, DOUGLAS [[id number]]  Attending Physician: CCS, MD [[id number]]  Certification:: I certify this patient will need inpatient services for at least 2 midnights  Estimated Length of Stay: 3          B Medical/Surgery History Past Medical History:  Diagnosis Date   Asthma    Environmental allergies    Hypertension    Past Surgical History:  Procedure Laterality Date   CARDIAC SURGERY     COLOSTOMY REVERSAL Right    during infancy   SMALL INTESTINE SURGERY     childhood   testicular removal     infancy     A IV Location/Drains/Wounds Patient Lines/Drains/Airways Status     Active Line/Drains/Airways     Name Placement date Placement time Site Days   Peripheral IV 08/18/22 20 G Anterior;Distal;Left;Upper Arm 08/18/22  0616  Arm  less than 1   NG/OG Vented/Dual Lumen 18 Fr. Left nare Marking at nare/corner of mouth 60 cm 08/18/22  0928  Left nare  less than 1            Intake/Output Last 24 hours No intake or output data in the 24  hours ending 08/18/22 1256  Labs/Imaging Results for orders placed or performed during the hospital encounter of 08/18/22 (from the past 48 hour(s))  Comprehensive metabolic panel     Status: Abnormal   Collection Time: 08/18/22  1:46 AM  Result Value Ref Range   Sodium 137 135 - 145 mmol/L   Potassium 4.5 3.5 - 5.1 mmol/L   Chloride 104 98 - 111 mmol/L   CO2 24 22 - 32 mmol/L   Glucose, Bld 127 (H) 70 - 99 mg/dL    Comment: Glucose reference range applies only to samples taken after fasting for at least 8 hours.   BUN 8 6 - 20 mg/dL   Creatinine, Ser 0.450.63 0.61 - 1.24 mg/dL   Calcium 9.3 8.9 - 40.910.3 mg/dL   Total Protein 8.7 (H) 6.5 - 8.1 g/dL   Albumin 4.5 3.5 - 5.0 g/dL   AST 21 15 - 41 U/L   ALT 21 0 - 44 U/L   Alkaline Phosphatase 60 38 - 126 U/L   Total Bilirubin 1.3 (H) 0.3 - 1.2 mg/dL   GFR, Estimated >81>60 >19>60 mL/min    Comment: (NOTE) Calculated using the CKD-EPI Creatinine Equation (2021)    Anion gap 9 5 - 15    Comment: Performed at [facility], [address] [place], [place] [id number]  Lipase, blood  Status: None   Collection Time: 08/18/22  1:46 AM  Result Value Ref Range   Lipase 30 11 - 51 U/L    Comment: Performed at [facility], [address], [place], [place] [address]  CBC with Diff     Status: Abnormal   Collection Time: 08/18/22  1:46 AM  Result Value Ref Range   WBC 12.8 (H) 4.0 - 10.5 K/uL   RBC 6.80 (H) 4.22 - 5.81 MIL/uL   Hemoglobin 18.7 (H) 13.0 - 17.0 g/dL   HCT 63.3 (H) 35.4 - 56.2 %   MCV 84.9 80.0 - 100.0 fL   MCH 27.5 26.0 - 34.0 pg   MCHC 32.4 30.0 - 36.0 g/dL   RDW 56.3 89.3 - 73.4 %   Platelets 167 150 - 400 K/uL   nRBC 0.0 0.0 - 0.2 %   Neutrophils Relative % 79 %   Neutro Abs 10.1 (H) 1.7 - 7.7 K/uL   Lymphocytes Relative 17 %   Lymphs Abs 2.1 0.7 - 4.0 K/uL   Monocytes Relative 4 %   Monocytes Absolute 0.5 0.1 - 1.0 K/uL   Eosinophils Relative 0 %   Eosinophils Absolute 0.0 0.0 - 0.5  K/uL   Basophils Relative 0 %   Basophils Absolute 0.0 0.0 - 0.1 K/uL   Immature Granulocytes 0 %   Abs Immature Granulocytes 0.04 0.00 - 0.07 K/uL    Comment: Performed at [facility], [address] [place], [place] [address]  Urinalysis, Routine w reflex microscopic Urine, Clean Catch     Status: Abnormal   Collection Time: 08/18/22  5:42 AM  Result Value Ref Range   Color, Urine YELLOW YELLOW   APPearance CLEAR CLEAR   Specific Gravity, Urine 1.025 1.005 - 1.030   pH 7.0 5.0 - 8.0   Glucose, UA NEGATIVE NEGATIVE mg/dL   Hgb urine dipstick NEGATIVE NEGATIVE   Bilirubin Urine NEGATIVE NEGATIVE   Ketones, ur 20 (A) NEGATIVE mg/dL   Protein, ur 30 (A) NEGATIVE mg/dL   Nitrite NEGATIVE NEGATIVE   Leukocytes,Ua NEGATIVE NEGATIVE   RBC / HPF 0-5 0 - 5 RBC/hpf   WBC, UA 0-5 0 - 5 WBC/hpf   Bacteria, UA NONE SEEN NONE SEEN   Squamous Epithelial / LPF 0-5 0 - 5 /HPF   Mucus PRESENT     Comment: Performed at [facility], [address], [place], [place] [address]  Resp panel by RT-PCR (RSV, Flu A&B, Covid) Anterior Nasal Swab     Status: None   Collection Time: 08/18/22  5:56 AM   Specimen: Anterior Nasal Swab  Result Value Ref Range   SARS Coronavirus 2 by RT PCR NEGATIVE NEGATIVE    Comment: (NOTE) SARS-CoV-2 target nucleic acids are NOT DETECTED.  The SARS-CoV-2 RNA is generally detectable in upper respiratory specimens during the acute phase of infection. The lowest concentration of SARS-CoV-2 viral copies this assay can detect is 138 copies/mL. A negative result does not preclude SARS-Cov-2 infection and should not be used as the sole basis for treatment or other patient management decisions. A negative result may occur with  improper specimen collection/handling, submission of specimen other than nasopharyngeal swab, presence of viral mutation(s) within the areas targeted by this assay, and inadequate number of viral copies(<138  copies/mL). A negative result must be combined with clinical observations, patient history, and epidemiological information. The expected result is Negative.  Fact Sheet for Patients:  BloggerCourse.com  Fact Sheet for Healthcare  Providers:  SeriousBroker.it  This test is no t yet approved or cleared by the [place] and  has been authorized for detection and/or diagnosis of SARS-CoV-2 by FDA under an Emergency Use Authorization (EUA). This EUA will remain  in effect (meaning this test can be used) for the duration of the COVID-19 declaration under Section 564(b)(1) of the Act, 21 U.S.C.section 360bbb-3(b)(1), unless the authorization is terminated  or revoked sooner.       Influenza A by PCR NEGATIVE NEGATIVE   Influenza B by PCR NEGATIVE NEGATIVE    Comment: (NOTE) The Xpert Xpress SARS-CoV-2/FLU/RSV plus assay is intended as an aid in the diagnosis of influenza from Nasopharyngeal swab specimens and should not be used as a sole basis for treatment. Nasal washings and aspirates are unacceptable for Xpert Xpress SARS-CoV-2/FLU/RSV testing.  Fact Sheet for Patients: BloggerCourse.com  Fact Sheet for Healthcare Providers: SeriousBroker.it  This test is not yet approved or cleared by the [place] FDA and has been authorized for detection and/or diagnosis of SARS-CoV-2 by FDA under an Emergency Use Authorization (EUA). This EUA will remain in effect (meaning this test can be used) for the duration of the COVID-19 declaration under Section 564(b)(1) of the Act, 21 U.S.C. section 360bbb-3(b)(1), unless the authorization is terminated or revoked.     Resp Syncytial Virus by PCR NEGATIVE NEGATIVE    Comment: (NOTE) Fact Sheet for Patients: BloggerCourse.com  Fact Sheet for Healthcare  Providers: SeriousBroker.it  This test is not yet approved or cleared by the [place] FDA and has been authorized for detection and/or diagnosis of SARS-CoV-2 by FDA under an Emergency Use Authorization (EUA). This EUA will remain in effect (meaning this test can be used) for the duration of the COVID-19 declaration under Section 564(b)(1) of the Act, 21 U.S.C. section 360bbb-3(b)(1), unless the authorization is terminated or revoked.  Performed at [facility], [address] [place], [place] [address]    DG Abd Portable 1V  Result Date: 08/18/2022 CLINICAL DATA:  Enteric tube placement EXAM: PORTABLE ABDOMEN - 1 VIEW COMPARISON:  Abdominal radiograph dated 08/18/2022 at 9:39 a.m. FINDINGS: Interval advancement of enteric tube with tip projecting over the distal stomach and side hole projecting over the gastric body. IMPRESSION: Interval advancement of enteric tube with tip projecting over the distal stomach. Electronically Signed   By: Agustin Cree M.D.   On: 08/18/2022 11:20   DG Abdomen 1 View  Result Date: 08/18/2022 CLINICAL DATA:  Nasogastric tube placement. Abdominal pain with vomiting. EXAM: ABDOMEN - 1 VIEW COMPARISON:  CT 08/18/2022. FINDINGS: 0939 hours. Single semi erect view of the abdomen centered on the epigastric region demonstrates the placement of an enteric tube which projects below the diaphragm, side hole near the gastroesophageal junction. The visualized bowel gas pattern is nonobstructive. The lung bases remain clear. IMPRESSION: Enteric tube tip projects over the proximal stomach with side hole near the GE junction. Electronically Signed   By: Carey Bullocks M.D.   On: 08/18/2022 09:49   CT ABDOMEN PELVIS W CONTRAST  Result Date: 08/18/2022 CLINICAL DATA:  Left lower quadrant pain.  Prior colostomy. EXAM: CT ABDOMEN AND PELVIS WITH CONTRAST TECHNIQUE: Multidetector CT imaging of the abdomen and pelvis was performed  using the standard protocol following bolus administration of intravenous contrast. RADIATION DOSE REDUCTION: This exam was performed according to the departmental dose-optimization program which includes automated exposure control, adjustment of the mA and/or kV according to patient size and/or use of iterative  reconstruction technique. CONTRAST:  OMNIPAQUE IOHEXOL 300 MG/ML  SOLN COMPARISON:  None Available. FINDINGS: Lower chest: There are few linear scar-like opacities in the lung bases but no acute process. There is a small hiatal hernia. The cardiac size is normal. Hepatobiliary: The liver is 20 cm in length, mildly steatotic without mass. The gallbladder and bile ducts are unremarkable. Pancreas: No abnormality. Spleen: No abnormality.  No splenomegaly. Adrenals/Urinary Tract: Adrenal glands are unremarkable. Kidneys are normal, without renal calculi, focal lesion, or hydronephrosis. Bladder is unremarkable allowing for the degree of distention. Stomach/Bowel: Small hiatal hernia. The stomach and proximal small bowel are unremarkable, but the mid to distal abdominal small bowel is dilated up to 3.5 cm. The transition appears to be along a bowel containing supraumbilical hernia with the hernia mouth 5.7 cm. This is probably about mid ileal in location with the rest of the small bowel decompressed. The appendix is normal in caliber. There is no large bowel thickening. There are uncomplicated diverticula. There are mild mesenteric congestive features in the area of greatest small-bowel dilatation in the anterior lower abdomen. Vascular/Lymphatic: No significant vascular findings are present. No enlarged abdominal or pelvic lymph nodes. Reproductive: Prostate is unremarkable. Other: Obstructing bowel containing supraumbilical ventral hernia as described above. There are small inguinal fat hernias. There is minimal pelvic ascites. No abdominal ascites, free air or free hemorrhage, or bowel pneumatosis is  seen. I do not see edema in the hernia sac. Musculoskeletal: No acute or significant osseous findings. IMPRESSION: 1. Small-bowel obstruction, approximately mid ileal, due to a bowel containing supraumbilical ventral hernia. 2. Mild mesenteric congestive features in the area of greatest small-bowel dilatation. 3. Minimal pelvic ascites. 4. Mildly prominent liver with mild steatosis. 5. Small hiatal hernia. 6. Uncomplicated diverticulosis. 7. Small inguinal fat hernias. Electronically Signed   By: Almira Bar M.D.   On: 08/18/2022 07:30    Pending Labs Unresulted Labs (From admission, onward)     Start     Ordered   08/19/22 0500  Basic metabolic panel  Tomorrow morning,   R        08/18/22 0922   08/19/22 0500  CBC  Tomorrow morning,   R        08/18/22 0922            Vitals/Pain Today's Vitals   08/18/22 0721 08/18/22 0840 08/18/22 0841 08/18/22 1005  BP: (!) 143/77 133/72 133/72 132/69  Pulse: 89 70 64 82  Resp: 18 18 16 16   Temp:    98.4 F (36.9 C)  TempSrc:    Oral  SpO2: 96% 98% 97% 93%  Weight:      Height:      PainSc:        Isolation Precautions Airborne and Contact precautions  Medications Medications  sodium chloride (PF) 0.9 % injection (  Not Given 08/18/22 0717)  albuterol (PROVENTIL) (2.5 MG/3ML) 0.083% nebulizer solution 2.5 mg (has no administration in time range)  mometasone-formoterol (DULERA) 100-5 MCG/ACT inhaler 2 puff (2 puffs Inhalation Not Given 08/18/22 1047)  enoxaparin (LOVENOX) injection 40 mg (40 mg Subcutaneous Given 08/18/22 1034)  0.9 % NaCl with KCl 20 mEq/ L  infusion ( Intravenous New Bag/Given 08/18/22 1033)  HYDROmorphone (DILAUDID) injection 1 mg (has no administration in time range)  diphenhydrAMINE (BENADRYL) capsule 25 mg (has no administration in time range)    Or  diphenhydrAMINE (BENADRYL) injection 25 mg (has no administration in time range)  ondansetron (ZOFRAN-ODT) disintegrating tablet 4 mg (  has no administration in  time range)    Or  ondansetron (ZOFRAN) injection 4 mg (has no administration in time range)  iohexol (OMNIPAQUE) 300 MG/ML solution 100 mL (100 mLs Intravenous Contrast Given 08/18/22 0647)  sodium chloride 0.9 % bolus 1,000 mL (1,000 mLs Intravenous New Bag/Given 08/18/22 0932)    Mobility walks Low fall risk   Focused Assessments Cardiac Assessment Handoff:    No results found for: "CKTOTAL", "CKMB", "CKMBINDEX", "TROPONINI" No results found for: "DDIMER" Does the Patient currently have chest pain? No    R Recommendations: See Admitting Provider Note  Report given to:   Additional Notes:

## 2022-08-18 NOTE — ED Provider Notes (Signed)
Steve Livingston   CSN: [id number] Arrival date & time: 08/18/22  3009     History  Chief Complaint  Patient presents with   Abdominal Pain   Emesis    Steve Livingston is a 26 y.o. male who presents with 24 hours of left lower abdominal pain.  States that pain was intermittent, sharp and achy at the same time but recently has become constant.  Pain intermittently 10 out of 10 but currently 6 out of 10.  When the pain is most severe he feels nauseous and vomits NBNB emesis.  Unclear when he was last passing flatus but states he had normal bowel movement in the last 48 hours.  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  Also states he has a hernia at the site of his prior surgical scar.  I personally read his medical records previous history of prediabetes, asthma, hypertension.  No anticoagulation.  HPI     Home Medications Prior to Admission medications   Medication Sig Start Date End Date Taking? Authorizing Provider  albuterol (PROVENTIL) (2.5 MG/3ML) 0.083% nebulizer solution Take 3 mLs (2.5 mg total) by nebulization every 6 (six) hours as needed for wheezing or shortness of breath (if inhaler unable to control symptoms (do not use both)). 03/29/22   Vassie Loll, MD  albuterol (VENTOLIN HFA) 108 (90 Base) MCG/ACT inhaler Inhale 1-2 puffs into the lungs every 6 (six) hours as needed for wheezing or shortness of breath. 03/29/22   Vassie Loll, MD  fluticasone-salmeterol (ADVAIR) 100-50 MCG/ACT AEPB Inhale 1 puff into the lungs 2 (two) times daily. 04/13/22   Jacquelin Hawking, PA-C      Allergies    Bee venom    Review of Systems   Review of Systems  Gastrointestinal:  Positive for abdominal pain, nausea and vomiting.    Physical Exam Updated Vital Signs BP (!) 149/94   Pulse 81   Temp 98 F (36.7 C) (Oral)   Resp 18   Ht 6' (1.829 m)   Wt  (!) 138.3 kg   SpO2 96%   BMI 41.37 kg/m  Physical Exam Vitals and nursing [name].  Constitutional:      Appearance: He is obese. He is not ill-appearing or toxic-appearing.  HENT:     Head: Normocephalic and atraumatic.     Mouth/Throat:     Mouth: Mucous membranes are moist.     Pharynx: No oropharyngeal exudate or posterior oropharyngeal erythema.  Eyes:     General:        Right eye: No discharge.        Left eye: No discharge.     Extraocular Movements: Extraocular movements intact.     Conjunctiva/sclera: Conjunctivae normal.     Pupils: Pupils are equal, round, and reactive to light.  Cardiovascular:     Rate and Rhythm: Normal rate and regular rhythm.     Pulses: Normal pulses.     Heart sounds: Normal heart sounds. No murmur heard. Pulmonary:     Effort: Pulmonary effort is normal. No respiratory distress.     Breath sounds: Normal breath sounds. No wheezing or rales.  Abdominal:     General: A surgical scar is present. Bowel sounds are normal. There is no distension.     Palpations: Abdomen is soft.     Tenderness: There is abdominal tenderness in the left lower quadrant. There is no  right CVA tenderness, left CVA tenderness, guarding or rebound.    Musculoskeletal:        General: No deformity.     Cervical back: Neck supple.  Skin:    General: Skin is warm and dry.     Capillary Refill: Capillary refill takes less than 2 seconds.  Neurological:     General: No focal deficit present.     Mental Status: He is alert. Mental status is at baseline.  Psychiatric:        Mood and Affect: Mood normal.     ED Results / Procedures / Treatments   Labs (all labs ordered are listed, but only abnormal results are displayed) Labs Reviewed  COMPREHENSIVE METABOLIC PANEL - Abnormal; Notable for the following components:      Result Value   Glucose, Bld 127 (*)    Total Protein 8.7 (*)    Total Bilirubin 1.3 (*)    All other components within normal limits   CBC WITH DIFFERENTIAL/PLATELET - Abnormal; Notable for the following components:   WBC 12.8 (*)    RBC 6.80 (*)    Hemoglobin 18.7 (*)    HCT 57.7 (*)    Neutro Abs 10.1 (*)    All other components within normal limits  URINALYSIS, ROUTINE W REFLEX MICROSCOPIC - Abnormal; Notable for the following components:   Ketones, ur 20 (*)    Protein, ur 30 (*)    All other components within normal limits  RESP PANEL BY RT-PCR (RSV, FLU A&B, COVID)  [id number]  LIPASE, BLOOD    EKG None  Radiology No results found.  Procedures Procedures    Medications Ordered in ED Medications  sodium chloride (PF) 0.9 % injection (has no administration in time range)  iohexol (OMNIPAQUE) 300 MG/ML solution 100 mL (100 mLs Intravenous Contrast Given 08/18/22 0647)    ED Course/ Medical Decision Making/ A&P Clinical Course as of 08/18/22 0704  Thu Aug 18, 2022  0638 24hr LLQ ab pain, intermittent now constant. Pain severe enough to make him vomit. Multiple abdominal surgeries. Was 10/10 now 6/10. Last bowel movement yesterday.  [CG]    Clinical Course User Index [CG] Al Decant, PA-C                           Medical Decision Making 26 year old male presents with concern for left lower abdominal pain x 24 hours.  Hypertensive on intake, vital signs otherwise normal.  Cardiopulmonary exam is normal, normal sinus rhythm below, left lower quadrant tenderness palpation large surgical scar with ventral hernia.  No CVAT.  DDx includes was limited to bowel obstruction, diverticulitis, enteritis, nephrolithiasis/ureterolithiasis, psoas abscess.  Amount and/or Complexity of Data Reviewed Labs: ordered.    Details: CBC with no leukocytosis of 12,000, elevated hemoglobin at 18, suspect hemoconcentration.  CMP with mild elevation in bilirubin to 1.3.  UA with ketonuria and proteinuria, RVP pan negative.   Radiology: ordered.  Risk Prescription drug management.   At time of shift change  care of this patient signed out to oncoming ED provider Jannifer Hick, PA-C pending CT of the abdomen pelvis.  Ultimate disposition and treatment plan pending completion of workup and reevaluation by oncoming ED team.  [name]  voiced understanding of his medical evaluation and treatment plan. Each of their questions answered to their expressed satisfaction.    This chart was dictated using voice recognition software, Dragon. Despite the best efforts of this provider to proofread  and correct errors, errors may still occur which can change documentation meaning.   Final Clinical Impression(s) / ED Diagnoses Final diagnoses:  None    Rx / DC Orders ED Discharge Orders     None         Sherrilee Gilles 08/18/22 0704    Paula Libra, MD 08/18/22 2240

## 2022-08-18 NOTE — H&P (Signed)
Admission Note  Steve Livingston [birth year]  OP:[id number].    Requesting MD: Dr. Elie Confer Chief Complaint/Reason for Consult: SBO, incarcerated hernia  HPI:  26 y.o. 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 Symptoms began yesterday, day of presentation. Since arrival to the ED he has passed some flatus and abdominal pain improved with medications. Last BM was small shortly before symptoms started yesterday. This is the first episode he knows of that he has had similar symptoms but states he may have had an obstruction as a child. He has had a ventral hernia for a long time. He also reports asthma with most recent flare several month ago. He is no longer on medication for HTN  Work up in ED with CT scan showing SBO with bowel containing supraumbilical ventral hernia  Substance use: former smoker Allergies: bee venom Blood thinners: none Past Surgeries: as above  Fiance bedside  ROS: ROS reviewed and as above otherwise negative  Family History  Problem Relation Age of Onset   Hypertension Mother    Obesity Mother    Hypertension Father    Hypertension Sister    Hypertension Brother    Cancer Maternal Grandmother    Diabetes Maternal Grandmother    Diabetes Maternal Grandfather    Hypertension Maternal Grandfather    Heart disease Maternal Grandfather    Stroke Maternal Grandfather     Past Medical History:  Diagnosis Date   Asthma    Environmental allergies    Hypertension     Past Surgical History:  Procedure Laterality Date   CARDIAC SURGERY     COLOSTOMY REVERSAL Right    during infancy   SMALL INTESTINE SURGERY     childhood   testicular removal     infancy    Social History:  reports that he quit smoking about 6 years ago. His smoking use included cigarettes. He quit  smokeless tobacco use about 8 years ago. He reports that he does not currently use alcohol. He reports current drug use. Drug: Marijuana.  Allergies:  Allergies  Allergen Reactions   Bee Venom Swelling    (Not in a hospital admission)   Blood pressure 133/72, pulse 64, temperature 98 F (36.7 C), temperature source Oral, resp. rate 16, height 6' (1.829 m), weight (!) 138.3 kg, SpO2 97 %. Physical Exam: General: pleasant, WD, male who is laying in bed in NAD HEENT: head is normocephalic, atraumatic.  Sclera are noninjected.  Pupils equal and round. EOMs intact.  Ears and nose without any masses or lesions.  Mouth is pink and moist Heart: regular, rate, and rhythm.  Normal s1,s2. No obvious murmurs, gallops, or rubs noted.  Palpable radial and pedal pulses bilaterally Lungs: CTAB, no wheezes, rhonchi, or rales noted.  Respiratory effort nonlabored Abd: soft, mildly distended, +BS, multiple well healed abdominal scars - midline and RLQ. Supraumbilical ventral hernia soft and partially reducible though recurs. No overlying skin changes. Mild TTP across abdomen without rebound or guarding or peritoneal signs MSK: all 4 extremities are symmetrical with no cyanosis, clubbing, or edema. Skin: warm and dry with no masses, lesions, or rashes Neuro: Cranial nerves 2-12 grossly intact, sensation is normal throughout Psych: A&Ox3 with an appropriate affect.    Results for orders placed or performed during the hospital encounter of 08/18/22 (from the past 48 hour(s))  Comprehensive metabolic  panel     Status: Abnormal   Collection Time: 08/18/22  1:46 AM  Result Value Ref Range   Sodium 137 135 - 145 mmol/L   Potassium 4.5 3.5 - 5.1 mmol/L   Chloride 104 98 - 111 mmol/L   CO2 24 22 - 32 mmol/L   Glucose, Bld 127 (H) 70 - 99 mg/dL    Comment: Glucose reference range applies only to samples taken after fasting for at least 8 hours.   BUN 8 6 - 20 mg/dL   Creatinine, Ser 3.22 0.61 - 1.24 mg/dL    Calcium 9.3 8.9 - 02.5 mg/dL   Total Protein 8.7 (H) 6.5 - 8.1 g/dL   Albumin 4.5 3.5 - 5.0 g/dL   AST 21 15 - 41 U/L   ALT 21 0 - 44 U/L   Alkaline Phosphatase 60 38 - 126 U/L   Total Bilirubin 1.3 (H) 0.3 - 1.2 mg/dL   GFR, Estimated >42 >70 mL/min    Comment: (NOTE) Calculated using the CKD-EPI Creatinine Equation (2021)    Anion gap 9 5 - 15    Comment: Performed at [facility], [address] [place], [place] [address]  Lipase, blood     Status: None   Collection Time: 08/18/22  1:46 AM  Result Value Ref Range   Lipase 30 11 - 51 U/L    Comment: Performed at [facility], [address] [place], [place] [address]  CBC with Diff     Status: Abnormal   Collection Time: 08/18/22  1:46 AM  Result Value Ref Range   WBC 12.8 (H) 4.0 - 10.5 K/uL   RBC 6.80 (H) 4.22 - 5.81 MIL/uL   Hemoglobin 18.7 (H) 13.0 - 17.0 g/dL   HCT 17.6 (H) 16.0 - 73.7 %   MCV 84.9 80.0 - 100.0 fL   MCH 27.5 26.0 - 34.0 pg   MCHC 32.4 30.0 - 36.0 g/dL   RDW 10.6 26.9 - 48.5 %   Platelets 167 150 - 400 K/uL   nRBC 0.0 0.0 - 0.2 %   Neutrophils Relative % 79 %   Neutro Abs 10.1 (H) 1.7 - 7.7 K/uL   Lymphocytes Relative 17 %   Lymphs Abs 2.1 0.7 - 4.0 K/uL   Monocytes Relative 4 %   Monocytes Absolute 0.5 0.1 - 1.0 K/uL   Eosinophils Relative 0 %   Eosinophils Absolute 0.0 0.0 - 0.5 K/uL   Basophils Relative 0 %   Basophils Absolute 0.0 0.0 - 0.1 K/uL   Immature Granulocytes 0 %   Abs Immature Granulocytes 0.04 0.00 - 0.07 K/uL    Comment: Performed at [facility], [address] [place], [place] [address]  Urinalysis, Routine w reflex microscopic Urine, Clean Catch     Status: Abnormal   Collection Time: 08/18/22  5:42 AM  Result Value Ref Range   Color, Urine YELLOW YELLOW   APPearance CLEAR CLEAR   Specific Gravity, Urine 1.025 1.005 - 1.030   pH 7.0 5.0 - 8.0   Glucose, UA NEGATIVE NEGATIVE mg/dL   Hgb urine dipstick NEGATIVE  NEGATIVE   Bilirubin Urine NEGATIVE NEGATIVE   Ketones, ur 20 (A) NEGATIVE mg/dL   Protein, ur 30 (A) NEGATIVE mg/dL   Nitrite NEGATIVE NEGATIVE   Leukocytes,Ua NEGATIVE NEGATIVE   RBC / HPF 0-5 0 - 5 RBC/hpf   WBC, UA 0-5 0 - 5 WBC/hpf   Bacteria, UA NONE SEEN NONE SEEN  Squamous Epithelial / LPF 0-5 0 - 5 /HPF   Mucus PRESENT     Comment: Performed at [facility], [place] [address], [place], [place] [address]  Resp panel by RT-PCR (RSV, Flu A&B, Covid) Anterior Nasal Swab     Status: None   Collection Time: 08/18/22  5:56 AM   Specimen: Anterior Nasal Swab  Result Value Ref Range   SARS Coronavirus 2 by RT PCR NEGATIVE NEGATIVE    Comment: (NOTE) SARS-CoV-2 target nucleic acids are NOT DETECTED.  The SARS-CoV-2 RNA is generally detectable in upper respiratory specimens during the acute phase of infection. The lowest concentration of SARS-CoV-2 viral copies this assay can detect is 138 copies/mL. A negative result does not preclude SARS-Cov-2 infection and should not be used as the sole basis for treatment or other patient management decisions. A negative result may occur with  improper specimen collection/handling, submission of specimen other than nasopharyngeal swab, presence of viral mutation(s) within the areas targeted by this assay, and inadequate number of viral copies(<138 copies/mL). A negative result must be combined with clinical observations, patient history, and epidemiological information. The expected result is Negative.  Fact Sheet for Patients:  EntrepreneurPulse.com.au  Fact Sheet for Healthcare Providers:  IncredibleEmployment.be  This test is no t yet approved or cleared by the [place] FDA and  has been authorized for detection and/or diagnosis of SARS-CoV-2 by FDA under an Emergency Use Authorization (EUA). This EUA will remain  in effect (meaning this test can be used) for the duration of  the COVID-19 declaration under Section 564(b)(1) of the Act, 21 U.S.C.section 360bbb-3(b)(1), unless the authorization is terminated  or revoked sooner.       Influenza A by PCR NEGATIVE NEGATIVE   Influenza B by PCR NEGATIVE NEGATIVE    Comment: (NOTE) The Xpert Xpress SARS-CoV-2/FLU/RSV plus assay is intended as an aid in the diagnosis of influenza from Nasopharyngeal swab specimens and should not be used as a sole basis for treatment. Nasal washings and aspirates are unacceptable for Xpert Xpress SARS-CoV-2/FLU/RSV testing.  Fact Sheet for Patients: EntrepreneurPulse.com.au  Fact Sheet for Healthcare Providers: IncredibleEmployment.be  This test is not yet approved or cleared by the [place] FDA and has been authorized for detection and/or diagnosis of SARS-CoV-2 by FDA under an Emergency Use Authorization (EUA). This EUA will remain in effect (meaning this test can be used) for the duration of the COVID-19 declaration under Section 564(b)(1) of the Act, 21 U.S.C. section 360bbb-3(b)(1), unless the authorization is terminated or revoked.     Resp Syncytial Virus by PCR NEGATIVE NEGATIVE    Comment: (NOTE) Fact Sheet for Patients: EntrepreneurPulse.com.au  Fact Sheet for Healthcare Providers: IncredibleEmployment.be  This test is not yet approved or cleared by the [place] FDA and has been authorized for detection and/or diagnosis of SARS-CoV-2 by FDA under an Emergency Use Authorization (EUA). This EUA will remain in effect (meaning this test can be used) for the duration of the COVID-19 declaration under Section 564(b)(1) of the Act, 21 U.S.C. section 360bbb-3(b)(1), unless the authorization is terminated or revoked.  Performed at [facility], [place] [address] [place], [place] [address]    CT ABDOMEN PELVIS W CONTRAST  Result Date: 08/18/2022 CLINICAL DATA:   Left lower quadrant pain.  Prior colostomy. EXAM: CT ABDOMEN AND PELVIS WITH CONTRAST TECHNIQUE: Multidetector CT imaging of the abdomen and pelvis was performed using the standard protocol following bolus administration of intravenous contrast. RADIATION DOSE REDUCTION: This  exam was performed according to the departmental dose-optimization program which includes automated exposure control, adjustment of the mA and/or kV according to patient size and/or use of iterative reconstruction technique. CONTRAST:  112mL OMNIPAQUE IOHEXOL 300 MG/ML  SOLN COMPARISON:  None Available. FINDINGS: Lower chest: There are few linear scar-like opacities in the lung bases but no acute process. There is a small hiatal hernia. The cardiac size is normal. Hepatobiliary: The liver is 20 cm in length, mildly steatotic without mass. The gallbladder and bile ducts are unremarkable. Pancreas: No abnormality. Spleen: No abnormality.  No splenomegaly. Adrenals/Urinary Tract: Adrenal glands are unremarkable. Kidneys are normal, without renal calculi, focal lesion, or hydronephrosis. Bladder is unremarkable allowing for the degree of distention. Stomach/Bowel: Small hiatal hernia. The stomach and proximal small bowel are unremarkable, but the mid to distal abdominal small bowel is dilated up to 3.5 cm. The transition appears to be along a bowel containing supraumbilical hernia with the hernia mouth 5.7 cm. This is probably about mid ileal in location with the rest of the small bowel decompressed. The appendix is normal in caliber. There is no large bowel thickening. There are uncomplicated diverticula. There are mild mesenteric congestive features in the area of greatest small-bowel dilatation in the anterior lower abdomen. Vascular/Lymphatic: No significant vascular findings are present. No enlarged abdominal or pelvic lymph nodes. Reproductive: Prostate is unremarkable. Other: Obstructing bowel containing supraumbilical ventral hernia as  described above. There are small inguinal fat hernias. There is minimal pelvic ascites. No abdominal ascites, free air or free hemorrhage, or bowel pneumatosis is seen. I do not see edema in the hernia sac. Musculoskeletal: No acute or significant osseous findings. IMPRESSION: 1. Small-bowel obstruction, approximately mid ileal, due to a bowel containing supraumbilical ventral hernia. 2. Mild mesenteric congestive features in the area of greatest small-bowel dilatation. 3. Minimal pelvic ascites. 4. Mildly prominent liver with mild steatosis. 5. Small hiatal hernia. 6. Uncomplicated diverticulosis. 7. Small inguinal fat hernias. Electronically Signed   By: Telford Nab M.D.   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 - No current indication for emergency surgery. Abdominal exam reassuring and hernia soft and partially reducible. - Place NGT for decompression and keep NPO. Consider protocol after adequate decompression - he is acutely dehydrated - bolus in ED and continue IVF - Keep K > 4 and Mg > 2 for bowel function - Mobilize for bowel function - Hopefully patient will improve with conservative management. If patient fails to improve with conservative management, he may require exploratory surgery during admission   FEN: NPO, NGT LIWS ID: none indicated VTE: lovenox   I reviewed ED provider notes, last 24 h vitals and pain scores, last 48 h intake and output, last 24 h labs and trends, and last 24 h imaging results.   Winferd Humphrey, [facility] 08/18/2022, 9:21 AM Please see Amion for pager number during day hours 7:00am-4:30pm

## 2022-08-18 NOTE — ED Triage Notes (Signed)
Pt reports with lower abdominal pain and vomiting since yesterday.

## 2022-08-18 NOTE — ED Provider Notes (Addendum)
  Physical Exam  BP 133/72 (BP Location: Left Arm)   Pulse 64   Temp 98 F (36.7 C) (Oral)   Resp 16   Ht 6' (1.829 m)   Wt (!) 138.3 kg   SpO2 97%   BMI 41.37 kg/m   Physical Exam Vitals and nursing note reviewed.  Constitutional:      General: He is not in acute distress.    Appearance: He is well-developed.  HENT:     Head: Normocephalic and atraumatic.  Eyes:     Conjunctiva/sclera: Conjunctivae normal.  Cardiovascular:     Rate and Rhythm: Normal rate and regular rhythm.     Heart sounds: No murmur heard. Pulmonary:     Effort: Pulmonary effort is normal. No respiratory distress.     Breath sounds: Normal breath sounds.  Abdominal:     Palpations: Abdomen is soft.     Tenderness: There is generalized abdominal tenderness.  Musculoskeletal:        General: No swelling.     Cervical back: Neck supple.  Skin:    General: Skin is warm and dry.     Capillary Refill: Capillary refill takes less than 2 seconds.  Neurological:     Mental Status: He is alert.  Psychiatric:        Mood and Affect: Mood normal.     Procedures  Procedures  ED Course / MDM   Clinical Course as of 08/18/22 0902  Thu Aug 18, 2022  0638 24hr LLQ ab pain, intermittent now constant. Pain severe enough to make him vomit. Multiple abdominal surgeries. Was 10/10 now 6/10. Last bowel movement yesterday.  [CG]    Clinical Course User Index [CG] Al Decant, PA-C   Medical Decision Making Amount and/or Complexity of Data Reviewed Labs: ordered. Radiology: ordered.  Risk Prescription drug management. Decision regarding hospitalization.   26 year old male presents to ED for 2 to 3 days of left lower abdominal pain.  Patient states that initially pain was intermittent, sharp and achy.  Patient reports that since this time the pain has become constant.  Patient reports that initially pain was 10 out of 10 however has decreased to 6 out of 10.  Patient stating he has been having  nausea and vomiting.  Patient reports that last bowel movement was this morning however was very soft, minimal.  Patient reports difficulty having bowel movements over the last 2 to 3 days, has not been passing flatus.  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  Patient signed out to me pending CT abdomen pelvis.  CT abdomen pelvis shows small bowel obstruction in the mid ileal region.  General surgery, Dr. Magnus Ivan, has stated that they will admit the patient for further management.  Patient stable at time of admission.       Al Decant, PA-C 08/18/22 0902    Benjiman Core, MD 08/18/22 [phone]

## 2022-08-19 ENCOUNTER — Inpatient Hospital Stay (HOSPITAL_COMMUNITY): Payer: Medicaid Other

## 2022-08-19 LAB — CBC
HCT: 42.4 % (ref 39.0–52.0)
Hemoglobin: 13.4 g/dL (ref 13.0–17.0)
MCH: 27.5 pg (ref 26.0–34.0)
MCHC: 31.6 g/dL (ref 30.0–36.0)
MCV: 87.1 fL (ref 80.0–100.0)
Platelets: 274 10*3/uL (ref 150–400)
RBC: 4.87 MIL/uL (ref 4.22–5.81)
RDW: 13.2 % (ref 11.5–15.5)
WBC: 9.5 10*3/uL (ref 4.0–10.5)
nRBC: 0 % (ref 0.0–0.2)

## 2022-08-19 LAB — BASIC METABOLIC PANEL
Anion gap: 6 (ref 5–15)
BUN: 13 mg/dL (ref 6–20)
CO2: 26 mmol/L (ref 22–32)
Calcium: 8.8 mg/dL — ABNORMAL LOW (ref 8.9–10.3)
Chloride: 106 mmol/L (ref 98–111)
Creatinine, Ser: 0.78 mg/dL (ref 0.61–1.24)
GFR, Estimated: >60 mL/min (ref >60)
Glucose, Bld: 89 mg/dL (ref 70–99)
Potassium: 3.7 mmol/L (ref 3.5–5.1)
Sodium: 138 mmol/L (ref 135–145)

## 2022-08-19 MED ORDER — POLYETHYLENE GLYCOL 3350 17 G PO PACK
17.0000 g | PACK | Freq: Every day | ORAL | Status: DC
  Administered 2022-08-19 – 2022-08-20 (×2): 17 g via ORAL
  Filled 2022-08-19 (×2): qty 1

## 2022-08-19 MED ORDER — ACETAMINOPHEN 325 MG PO TABS: 650.0000 mg | ORAL_TABLET | Freq: Four times a day (QID) | ORAL | Status: DC | PRN

## 2022-08-19 NOTE — Progress Notes (Signed)
  Transition of Care (TOC) Screening Note   Patient Details  Name: Steve Livingston Date of Birth: Aug 31, 1995   Transition of Care [facility]) CM/SW Contact:    Otelia Santee, LCSW Phone Number: 08/19/2022, 2:21 PM    Transition of Care Department [facility]) has reviewed patient and no TOC needs have been identified at this time. We will continue to monitor patient advancement through interdisciplinary progression rounds. If new patient transition needs arise, please place a TOC consult.

## 2022-08-19 NOTE — Progress Notes (Signed)
Patient reported that his NG tube came out while he was asleep, and that he was passing a lot of gas since last night and this morning.On call provider  Dr. Magnus Ivan notified , with a verbal order to hold on reinsertion and we'll evaluate this morning.

## 2022-08-19 NOTE — Progress Notes (Signed)
Subjective/Chief Complaint: [name] came out, having a lot of flatus   Objective: Vital signs in last 24 hours: Temp:  [98 F (36.7 C)-98.9 F (37.2 C)] 98 F (36.7 C) (12/29 0556) Pulse Rate:  [75-94] 85 (12/29 0556) Resp:  [16-18] 16 (12/29 0556) BP: (125-157)/(69-104) 125/95 (12/29 0556) SpO2:  [93 %-99 %] 96 % (12/29 0804) Last BM Date : 08/17/22  Intake/Output from previous day: 12/28 0701 - 12/29 0700 In: 2590.8 [P.O.:120; I.V.:2470.8] Out: 1300 [Emesis/NG output:1300] Intake/Output this shift: No intake/output data recorded.  Ab soft nontender reducible hernia  Lab Results:  Recent Labs    08/18/22 0146 08/19/22 0542  WBC 12.8* 9.5  HGB 18.7* 13.4  HCT 57.7* 42.4  PLT 167 274   BMET Recent Labs    08/18/22 0146 08/19/22 0542  NA 137 138  K 4.5 3.7  CL 104 106  CO2 24 26  GLUCOSE 127* 89  BUN 8 13  CREATININE 0.63 0.78  CALCIUM 9.3 8.8*   PT/INR No results for input(s): "LABPROT", "INR" in the last 72 hours. ABG No results for input(s): "PHART", "HCO3" in the last 72 hours.  Invalid input(s): "PCO2", "PO2"  Studies/Results: DG Abd Portable 1V  Result Date: 08/19/2022 CLINICAL DATA:  Small bowel obstruction, dislodged nasogastric tube, passing gas. EXAM: PORTABLE ABDOMEN - 1 VIEW COMPARISON:  08/18/2022. FINDINGS: No residual small bowel dilatation. Bowel gas pattern is normal. Rectal gas is visualized. No unexpected radiopaque calculi. IMPRESSION: Normal bowel gas pattern. Electronically Signed   By: Leanna Battles M.D.   On: 08/19/2022 08:49   DG Abd Portable 1V  Result Date: 08/18/2022 CLINICAL DATA:  Enteric tube placement EXAM: PORTABLE ABDOMEN - 1 VIEW COMPARISON:  Abdominal radiograph dated 08/18/2022 at 9:39 a.m. FINDINGS: Interval advancement of enteric tube with tip projecting over the distal stomach and side hole projecting over the gastric body. IMPRESSION: Interval advancement of enteric tube with tip projecting over the distal  stomach. Electronically Signed   By: Agustin Cree M.D.   On: 08/18/2022 11:20   DG Abdomen 1 View  Result Date: 08/18/2022 CLINICAL DATA:  Nasogastric tube placement. Abdominal pain with vomiting. EXAM: ABDOMEN - 1 VIEW COMPARISON:  CT 08/18/2022. FINDINGS: 0939 hours. Single semi erect view of the abdomen centered on the epigastric region demonstrates the placement of an enteric tube which projects below the diaphragm, side hole near the gastroesophageal junction. The visualized bowel gas pattern is nonobstructive. The lung bases remain clear. IMPRESSION: Enteric tube tip projects over the proximal stomach with side hole near the GE junction. Electronically Signed   By: Carey Bullocks M.D.   On: 08/18/2022 09:49   CT ABDOMEN PELVIS W CONTRAST  Result Date: 08/18/2022 CLINICAL DATA:  Left lower quadrant pain.  Prior colostomy. EXAM: CT ABDOMEN AND PELVIS WITH CONTRAST TECHNIQUE: Multidetector CT imaging of the abdomen and pelvis was performed using the standard protocol following bolus administration of intravenous contrast. RADIATION DOSE REDUCTION: This exam was performed according to the departmental dose-optimization program which includes automated exposure control, adjustment of the mA and/or kV according to patient size and/or use of iterative reconstruction technique. CONTRAST:  OMNIPAQUE IOHEXOL 300 MG/ML  SOLN COMPARISON:  None Available. FINDINGS: Lower chest: There are few linear scar-like opacities in the lung bases but no acute process. There is a small hiatal hernia. The cardiac size is normal. Hepatobiliary: The liver is 20 cm in length, mildly steatotic without mass. The gallbladder and bile ducts are unremarkable. Pancreas: No abnormality.  Spleen: No abnormality.  No splenomegaly. Adrenals/Urinary Tract: Adrenal glands are unremarkable. Kidneys are normal, without renal calculi, focal lesion, or hydronephrosis. Bladder is unremarkable allowing for the degree of distention.  Stomach/Bowel: Small hiatal hernia. The stomach and proximal small bowel are unremarkable, but the mid to distal abdominal small bowel is dilated up to 3.5 cm. The transition appears to be along a bowel containing supraumbilical hernia with the hernia mouth 5.7 cm. This is probably about mid ileal in location with the rest of the small bowel decompressed. The appendix is normal in caliber. There is no large bowel thickening. There are uncomplicated diverticula. There are mild mesenteric congestive features in the area of greatest small-bowel dilatation in the anterior lower abdomen. Vascular/Lymphatic: No significant vascular findings are present. No enlarged abdominal or pelvic lymph nodes. Reproductive: Prostate is unremarkable. Other: Obstructing bowel containing supraumbilical ventral hernia as described above. There are small inguinal fat hernias. There is minimal pelvic ascites. No abdominal ascites, free air or free hemorrhage, or bowel pneumatosis is seen. I do not see edema in the hernia sac. Musculoskeletal: No acute or significant osseous findings. IMPRESSION: 1. Small-bowel obstruction, approximately mid ileal, due to a bowel containing supraumbilical ventral hernia. 2. Mild mesenteric congestive features in the area of greatest small-bowel dilatation. 3. Minimal pelvic ascites. 4. Mildly prominent liver with mild steatosis. 5. Small hiatal hernia. 6. Uncomplicated diverticulosis. 7. Small inguinal fat hernias. Electronically Signed   By: Almira Bar M.D.   On: 08/18/2022 07:30    Anti-infectives: Anti-infectives (From admission, onward)    None       Assessment/Plan: SBO -clinically resolved -will  do fulls today, adat -hopefull home in am -not sure if this was related to hernia or adhesions  Steve Livingston 08/19/2022

## 2022-08-20 NOTE — Plan of Care (Signed)
Patient is stable for discharge. Discharge instructions have been given. All questions answered. Patient is discharged home with family.

## 2022-08-20 NOTE — Discharge Summary (Signed)
Physician Discharge Summary  Patient ID: Domingos Riggi MRN: [id number] DOB/AGE: [birth year] 26 y.o.  Admit date: 08/18/2022 Discharge date: 08/20/2022  Admission Diagnoses: Sbo Incisional hernia  Discharge Diagnoses:  Principal Problem:   SBO (small bowel obstruction) (HCC)   Discharged Condition: good  Hospital Course: 26 y.o. 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 Symptoms began yesterday, day of presentation. Since arrival to the ED he has passed some flatus and abdominal pain improved with medications. Last BM was small shortly before symptoms started yesterday. This is the first episode he knows of that he has had similar symptoms but states he may have had an obstruction as a child. He has had a ventral hernia for a long time. He resolved quickly with ng tube and is tolerating diet and passing stool/flatus.  Will discharge home His hernia is soft and reducible.  I told him if he would like to discuss repair which I think he should consider can see [place] in office.     Consults: None  Significant Diagnostic Studies: radiology: CT scan:    Treatments: IV hydration  Discharge Exam: Blood pressure (!) 155/80, pulse 77, temperature 97.8 F (36.6 C), temperature source Oral, resp. rate 16, height 6' (1.829 m), weight (!) 138.3 kg, SpO2 97 %. Ab soft nontender nondistended hernia reducible  Disposition: Discharge disposition: 01-Home or Self Care          Follow-up Information     [facility] Follow up.   Specialty: General Surgery Why: As needed or if you would like to discuss hernia repair Contact information: [address] [place] [address] [phone]                Signed: Emelia Loron 08/20/2022, 7:41 AM

## 2022-08-21 NOTE — ED Notes (Signed)
Pt requesting work note. Opened chart to provide note then realized he was discharged from upstairs as an admitted. Encouraged to f/u with his PCP for work note.

## 2022-08-24 ENCOUNTER — Encounter: Payer: Self-pay | Admitting: Physician Assistant

## 2022-08-24 ENCOUNTER — Ambulatory Visit: Payer: Self-pay | Admitting: Physician Assistant

## 2022-08-24 VITALS — BP 120/66 | HR 88 | Temp 97.9°F | Ht 72.0 in | Wt 352.5 lb

## 2022-08-24 DIAGNOSIS — J45909 Unspecified asthma, uncomplicated: Secondary | ICD-10-CM

## 2022-08-24 MED ORDER — FLUTICASONE-SALMETEROL 100-50 MCG/ACT IN AEPB: 1.0000 | INHALATION_SPRAY | Freq: Two times a day (BID) | RESPIRATORY_TRACT | 0 refills | Status: DC

## 2022-08-24 MED ORDER — ALBUTEROL SULFATE HFA 108 (90 BASE) MCG/ACT IN AERS: 1.0000 | INHALATION_SPRAY | Freq: Four times a day (QID) | RESPIRATORY_TRACT | 0 refills | Status: DC | PRN

## 2022-08-24 NOTE — Progress Notes (Signed)
BP 120/66   Pulse 88   Temp 97.9 F (36.6 C)   Ht 6' (1.829 m)   Wt (!) 352 lb 8 oz (159.9 kg)   SpO2 95%   BMI 47.81 kg/m    Subjective:    Patient ID: Steve Livingston, male    DOB: 1995/10/17, 27 y.o.   MRN: [id number]  HPI: Steve Livingston is a 27 y.o. male presenting on 08/24/2022 for Follow-up (Pt states he is feeling better from his visit in the hospital for small bowel obstruction. Pt's last BM was this morning. Pt is currently taking miralax.//Pt has run out of inhalers. Rx from hospital was $100 so pt did not get it filled.)   HPI   Chief Complaint  Patient presents with   Follow-up    Pt states he is feeling better from his visit in the hospital for small bowel obstruction. Pt's last BM was this morning. Pt is currently taking miralax.  Pt has run out of inhalers. Rx from hospital was $100 so pt did not get it filled.    He is still eating soft foods-  he was told to eat only soft foods for 2 weeks.   He is using miralax.  He is moving his bowel.  He has No abdominal pain.   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          Relevant past medical, surgical, family and social history reviewed and updated as indicated. Interim medical history since our last visit reviewed. Allergies and medications reviewed and updated.   Current Outpatient Medications:    albuterol (PROVENTIL) (2.5 MG/3ML) 0.083% nebulizer solution, Take 3 mLs (2.5 mg total) by nebulization every 6 (six) hours as needed for wheezing or shortness of breath (if inhaler unable to control symptoms (do not use both))., Disp: 75 mL, Rfl: 1   Polyethylene Glycol 3350 (MIRALAX PO), Take by mouth., Disp: , Rfl:    albuterol (VENTOLIN HFA) 108 (90 Base) MCG/ACT inhaler, Inhale 1-2 puffs into the lungs every 6 (six) hours as needed for wheezing or shortness of breath., Disp: 3 each, Rfl: 0   fluticasone-salmeterol (ADVAIR) 100-50 MCG/ACT AEPB, Inhale 1 puff into the lungs 2 (two)  times daily., Disp: 3 each, Rfl: 0    Review of Systems  Per HPI unless specifically indicated above     Objective:    BP 120/66   Pulse 88   Temp 97.9 F (36.6 C)   Ht 6' (1.829 m)   Wt (!) 352 lb 8 oz (159.9 kg)   SpO2 95%   BMI 47.81 kg/m   Wt Readings from Last 3 Encounters:  08/24/22 (!) 352 lb 8 oz (159.9 kg)  08/18/22 (!) 305 lb (138.3 kg)  05/18/22 (!) 364 lb (165.1 kg)    Physical Exam Vitals reviewed.  Constitutional:      General: He is not in acute distress.    Appearance: He is well-developed. He is obese. He is not toxic-appearing.  HENT:     Head: Normocephalic and atraumatic.  Cardiovascular:     Rate and Rhythm: Normal rate and regular rhythm.  Pulmonary:     Effort: Pulmonary effort is normal.     Breath sounds: Normal breath sounds. No wheezing.  Abdominal:     General: A surgical scar is present. Bowel sounds are normal.     Palpations: Abdomen is soft.     Tenderness: There is no abdominal tenderness. There is  no guarding or rebound.     Hernia: A hernia is present.  Musculoskeletal:     Cervical back: Neck supple.  Lymphadenopathy:     Cervical: No cervical adenopathy.  Skin:    General: Skin is warm and dry.  Neurological:     Mental Status: He is alert and oriented to person, place, and time.  Psychiatric:        Behavior: Behavior normal.           Assessment & Plan:    Encounter Diagnosis  Name Primary?   Uncomplicated asthma, unspecified asthma severity, unspecified whether persistent Yes     -inhalers Rx sent to [facility] -pt is given note to RTW -pt to continue his soft foods for 2 weeks as instructed by hospital -discussed surgery since surgeon discussed it with him when he was in hospital (for hernia repair).  Pt says he does not want to do that at this time.    -pt will follow up in 3 months.  He is to contact office sooner prn

## 2022-08-29 ENCOUNTER — Ambulatory Visit: Payer: Self-pay | Admitting: Physician Assistant

## 2022-09-12 ENCOUNTER — Encounter: Payer: Self-pay | Attending: Physician Assistant | Admitting: Nutrition

## 2022-11-23 ENCOUNTER — Encounter: Payer: Self-pay | Admitting: Physician Assistant

## 2022-11-23 ENCOUNTER — Ambulatory Visit: Payer: Self-pay | Admitting: Physician Assistant

## 2022-11-23 VITALS — BP 134/68 | HR 85 | Temp 97.2°F | Ht 72.0 in | Wt 359.5 lb

## 2022-11-23 DIAGNOSIS — J45909 Unspecified asthma, uncomplicated: Secondary | ICD-10-CM

## 2022-11-23 DIAGNOSIS — J302 Other seasonal allergic rhinitis: Secondary | ICD-10-CM

## 2022-11-23 MED ORDER — ALBUTEROL SULFATE HFA 108 (90 BASE) MCG/ACT IN AERS: 1.0000 | INHALATION_SPRAY | Freq: Four times a day (QID) | RESPIRATORY_TRACT | 0 refills | Status: DC | PRN

## 2022-11-23 NOTE — Patient Instructions (Signed)
Antihistamines (to help allergies)  ALLEGRA CLARITIN ZYRTEC BENEDRYL

## 2022-11-23 NOTE — Progress Notes (Signed)
BP 134/68   Pulse 85   Temp (!) 97.2 F (36.2 C)   Ht 6' (1.829 m)   Wt (!) 359 lb 8 oz (163.1 kg)   SpO2 94%   BMI 48.76 kg/m    Subjective:    Patient ID: Steve Livingston, male    DOB: 09-15-1995, 27 y.o.   MRN: [id number]  HPI: Steve Livingston is a 27 y.o. male presenting on 11/23/2022 for Asthma   HPI  Chief Complaint  Patient presents with   Asthma     Pt says he is Out of all his meds- he says his breathing is bad, that he is all stopped up.  He is using robitussin but no antihistamines for his alleregies  Pt is still working as a Presenter, broadcasting.  He is now working in [place].    In a month he is going to [place] but he doesn't want to go.  He says his [employer] is in July.     Relevant past medical, surgical, family and social history reviewed and updated as indicated. Interim medical history since our last visit reviewed. Allergies and medications reviewed and updated.   CURRENT MEDS: None    Current Outpatient Medications:    albuterol (PROVENTIL) (2.5 MG/3ML) 0.083% nebulizer solution, Take 3 mLs (2.5 mg total) by nebulization every 6 (six) hours as needed for wheezing or shortness of breath (if inhaler unable to control symptoms (do not use both)). (Patient not taking: Reported on 11/23/2022), Disp: 75 mL, Rfl: 1   albuterol (VENTOLIN HFA) 108 (90 Base) MCG/ACT inhaler, Inhale 1-2 puffs into the lungs every 6 (six) hours as needed for wheezing or shortness of breath. (Patient not taking: Reported on 11/23/2022), Disp: 3 each, Rfl: 0   fluticasone-salmeterol (ADVAIR) 100-50 MCG/ACT AEPB, Inhale 1 puff into the lungs 2 (two) times daily. (Patient not taking: Reported on 11/23/2022), Disp: 3 each, Rfl: 0   Polyethylene Glycol 3350 (MIRALAX PO), Take by mouth. (Patient not taking: Reported on 11/23/2022), Disp: , Rfl:     Review of Systems  Per HPI unless specifically indicated above     Objective:    BP 134/68   Pulse 85   Temp (!) 97.2 F (36.2 C)   Ht 6'  (1.829 m)   Wt (!) 359 lb 8 oz (163.1 kg)   SpO2 94%   BMI 48.76 kg/m   Wt Readings from Last 3 Encounters:  11/23/22 (!) 359 lb 8 oz (163.1 kg)  08/24/22 (!) 352 lb 8 oz (159.9 kg)  08/18/22 (!) 305 lb (138.3 kg)    Physical Exam Vitals reviewed.  Constitutional:      General: He is not in acute distress.    Appearance: He is well-developed. He is obese. He is not ill-appearing or toxic-appearing.  HENT:     Head: Normocephalic and atraumatic.  Cardiovascular:     Rate and Rhythm: Normal rate and regular rhythm.  Pulmonary:     Effort: Pulmonary effort is normal. No respiratory distress.     Breath sounds: Normal breath sounds. No stridor. No wheezing, rhonchi or rales.     Comments: CTA without wheezes Abdominal:     General: Bowel sounds are normal.     Palpations: Abdomen is soft.     Tenderness: There is no abdominal tenderness.  Musculoskeletal:     Cervical back: Neck supple.  Lymphadenopathy:     Cervical: No cervical adenopathy.  Skin:    General: Skin is warm and dry.  Neurological:     Mental Status: He is alert and oriented to person, place, and time.  Psychiatric:        Behavior: Behavior normal.          Assessment & Plan:   Encounter Diagnoses  Name Primary?   Uncomplicated asthma, unspecified asthma severity, unspecified whether persistent Yes   Seasonal allergies    Class 3 severe obesity with body mass index (BMI) of 45.0 to 49.9 in adult, unspecified obesity type, unspecified whether serious comorbidity present      -He has medicaid now.  Discussed that he will need to establish with new PCP as he is no longer eligible for [facility]    -discussed his inhalers and he agrees that he doesn't need all that he was on previously.  He was given sample albuterol mdi.   -pt recommended to use OTC antihistamine.  He was given a list with names of OTC antihistamines so he will know what will help.

## 2022-12-30 ENCOUNTER — Emergency Department (HOSPITAL_COMMUNITY): Payer: Medicaid Other

## 2022-12-30 ENCOUNTER — Ambulatory Visit
Admission: EM | Admit: 2022-12-30 | Discharge: 2022-12-30 | Disposition: A | Discharge status: Home / Self Care | Payer: Medicaid Other | Attending: Urgent Care | Admitting: Urgent Care

## 2022-12-30 ENCOUNTER — Observation Stay (HOSPITAL_COMMUNITY)
Admission: EM | Admit: 2022-12-30 | Discharge: 2022-12-31 | Disposition: A | Discharge status: Home / Self Care | Payer: Medicaid Other | Attending: Internal Medicine | Admitting: Internal Medicine

## 2022-12-30 ENCOUNTER — Other Ambulatory Visit: Payer: Self-pay

## 2022-12-30 DIAGNOSIS — J4541 Moderate persistent asthma with (acute) exacerbation: Secondary | ICD-10-CM

## 2022-12-30 DIAGNOSIS — Z87891 Personal history of nicotine dependence: Secondary | ICD-10-CM | POA: Diagnosis not present

## 2022-12-30 DIAGNOSIS — J4542 Moderate persistent asthma with status asthmaticus: Secondary | ICD-10-CM | POA: Diagnosis not present

## 2022-12-30 DIAGNOSIS — Z79899 Other long term (current) drug therapy: Secondary | ICD-10-CM | POA: Insufficient documentation

## 2022-12-30 DIAGNOSIS — Z7952 Long term (current) use of systemic steroids: Secondary | ICD-10-CM | POA: Diagnosis not present

## 2022-12-30 DIAGNOSIS — J9601 Acute respiratory failure with hypoxia: Secondary | ICD-10-CM

## 2022-12-30 DIAGNOSIS — I10 Essential (primary) hypertension: Secondary | ICD-10-CM | POA: Diagnosis not present

## 2022-12-30 DIAGNOSIS — J45902 Unspecified asthma with status asthmaticus: Secondary | ICD-10-CM | POA: Diagnosis present

## 2022-12-30 DIAGNOSIS — R0600 Dyspnea, unspecified: Secondary | ICD-10-CM

## 2022-12-30 DIAGNOSIS — J4551 Severe persistent asthma with (acute) exacerbation: Secondary | ICD-10-CM | POA: Diagnosis not present

## 2022-12-30 DIAGNOSIS — R0602 Shortness of breath: Secondary | ICD-10-CM | POA: Diagnosis present

## 2022-12-30 LAB — CBC WITH DIFFERENTIAL/PLATELET
Abs Immature Granulocytes: 0.03 10*3/uL (ref 0.00–0.07)
Basophils Absolute: 0 10*3/uL (ref 0.0–0.1)
Basophils Relative: 0 %
Eosinophils Absolute: 0.1 10*3/uL (ref 0.0–0.5)
Eosinophils Relative: 1 %
HCT: 42.6 % (ref 39.0–52.0)
Hemoglobin: 13.1 g/dL (ref 13.0–17.0)
Immature Granulocytes: 0 %
Lymphocytes Relative: 25 %
Lymphs Abs: 2.7 10*3/uL (ref 0.7–4.0)
MCH: 27.4 pg (ref 26.0–34.0)
MCHC: 30.8 g/dL (ref 30.0–36.0)
MCV: 89.1 fL (ref 80.0–100.0)
Monocytes Absolute: 0.7 10*3/uL (ref 0.1–1.0)
Monocytes Relative: 7 %
Neutro Abs: 7.1 10*3/uL (ref 1.7–7.7)
Neutrophils Relative %: 67 %
Platelets: 247 10*3/uL (ref 150–400)
RBC: 4.78 MIL/uL (ref 4.22–5.81)
RDW: 12.5 % (ref 11.5–15.5)
WBC: 10.7 10*3/uL — ABNORMAL HIGH (ref 4.0–10.5)
nRBC: 0 % (ref 0.0–0.2)

## 2022-12-30 LAB — BASIC METABOLIC PANEL
Anion gap: 11 (ref 5–15)
BUN: 8 mg/dL (ref 6–20)
CO2: 24 mmol/L (ref 22–32)
Calcium: 9 mg/dL (ref 8.9–10.3)
Chloride: 101 mmol/L (ref 98–111)
Creatinine, Ser: 0.98 mg/dL (ref 0.61–1.24)
GFR, Estimated: >60 mL/min (ref >60)
Glucose, Bld: 137 mg/dL — ABNORMAL HIGH (ref 70–99)
Potassium: 3.6 mmol/L (ref 3.5–5.1)
Sodium: 136 mmol/L (ref 135–145)

## 2022-12-30 MED ORDER — IPRATROPIUM-ALBUTEROL 0.5-2.5 (3) MG/3ML IN SOLN
3.0000 mL | Freq: Once | RESPIRATORY_TRACT | Status: AC
  Administered 2022-12-30: 3 mL via RESPIRATORY_TRACT
  Filled 2022-12-30: qty 3

## 2022-12-30 MED ORDER — IPRATROPIUM-ALBUTEROL 0.5-2.5 (3) MG/3ML IN SOLN: 3.0000 mL | RESPIRATORY_TRACT | Status: DC

## 2022-12-30 MED ORDER — ONDANSETRON HCL 4 MG/2ML IJ SOLN: 4.0000 mg | Freq: Four times a day (QID) | INTRAMUSCULAR | Status: DC | PRN

## 2022-12-30 MED ORDER — ADULT MULTIVITAMIN W/MINERALS CH
1.0000 | ORAL_TABLET | Freq: Every day | ORAL | Status: DC
  Administered 2022-12-30 – 2022-12-31 (×2): 1 via ORAL
  Filled 2022-12-30 (×2): qty 1

## 2022-12-30 MED ORDER — LABETALOL HCL 5 MG/ML IV SOLN
5.0000 mg | INTRAVENOUS | Status: DC | PRN
  Filled 2022-12-30 (×2): qty 4

## 2022-12-30 MED ORDER — POLYETHYLENE GLYCOL 3350 17 G PO PACK: 17.0000 g | PACK | Freq: Every day | ORAL | Status: DC | PRN

## 2022-12-30 MED ORDER — IPRATROPIUM-ALBUTEROL 0.5-2.5 (3) MG/3ML IN SOLN
3.0000 mL | Freq: Once | RESPIRATORY_TRACT | Status: AC
  Administered 2022-12-30: 3 mL via RESPIRATORY_TRACT

## 2022-12-30 MED ORDER — ONDANSETRON HCL 4 MG PO TABS: 4.0000 mg | ORAL_TABLET | Freq: Four times a day (QID) | ORAL | Status: DC | PRN

## 2022-12-30 MED ORDER — MOMETASONE FURO-FORMOTEROL FUM 100-5 MCG/ACT IN AERO
2.0000 | INHALATION_SPRAY | Freq: Two times a day (BID) | RESPIRATORY_TRACT | Status: DC
  Administered 2022-12-30 – 2022-12-31 (×2): 2 via RESPIRATORY_TRACT
  Filled 2022-12-30: qty 8.8

## 2022-12-30 MED ORDER — ENOXAPARIN SODIUM 80 MG/0.8ML IJ SOSY
80.0000 mg | PREFILLED_SYRINGE | INTRAMUSCULAR | Status: DC
  Administered 2022-12-30: 80 mg via SUBCUTANEOUS
  Filled 2022-12-30 (×2): qty 0.8

## 2022-12-30 MED ORDER — METHYLPREDNISOLONE SODIUM SUCC 125 MG IJ SOLR
125.0000 mg | Freq: Once | INTRAMUSCULAR | Status: AC
  Administered 2022-12-30: 125 mg via INTRAVENOUS
  Filled 2022-12-30: qty 2

## 2022-12-30 MED ORDER — SODIUM CHLORIDE 0.9 % IV SOLN: INTRAVENOUS | Status: AC

## 2022-12-30 MED ORDER — ENOXAPARIN SODIUM 40 MG/0.4ML IJ SOSY: 40.0000 mg | PREFILLED_SYRINGE | INTRAMUSCULAR | Status: DC

## 2022-12-30 MED ORDER — PREDNISONE 10 MG PO TABS
60.0000 mg | ORAL_TABLET | Freq: Every day | ORAL | Status: DC
  Administered 2022-12-31: 60 mg via ORAL
  Filled 2022-12-30: qty 6

## 2022-12-30 MED ORDER — ACETAMINOPHEN 325 MG PO TABS: 650.0000 mg | ORAL_TABLET | Freq: Four times a day (QID) | ORAL | Status: DC | PRN

## 2022-12-30 NOTE — ED Notes (Signed)
[facility] here for transport 

## 2022-12-30 NOTE — ED Triage Notes (Signed)
Pt to the ed [facility] via [facility]  with a CC of sob. Pt has a hx of asthma and takes an inhaler. Pt was given multiple breathing treatments at [facility]. EMS relays a o2 saturation of 93 on room air when they arrived. Pt was placed on 02 and given additional breathing  treatment.

## 2022-12-30 NOTE — ED Notes (Signed)
ED TO INPATIENT HANDOFF REPORT  ED Nurse Name and Phone #: [phone]  S Name/Age/Gender Steve Livingston 27 y.o. male Room/Bed: 031C/031C  Code Status   Code Status: Full Code  Home/SNF/Other Home Patient oriented to: self, place, time, and situation Is this baseline? Yes   Triage Complete: Triage complete  Chief Complaint Status asthmaticus [J45.902]  Triage Note Pt to the ed [facility]  with a CC of sob. Pt has a hx of asthma and takes an inhaler. Pt was given multiple breathing treatments at UC. EMS relays a o2 saturation of 93 on room air when they arrived. Pt was placed on 02 and given additional breathing  treatment.     Allergies Allergies  Allergen Reactions   Bee Venom Swelling    Level of Care/Admitting Diagnosis ED Disposition     ED Disposition  Admit   Condition  --   Comment  Hospital Area: [facility] [[id number]]  Level of Care: Med-Surg [16]  May place patient in observation at [facility] or [facility] if equivalent level of care is available:: Yes  Covid Evaluation: Asymptomatic - no recent exposure (last 10 days) testing not required  Diagnosis: Status asthmaticus [829562]  Admitting Physician: Sharon Seller, JEFFREY T [[id number]]  Attending Physician: Sharon Seller, JEFFREY T [[id number]]          B Medical/Surgery History Past Medical History:  Diagnosis Date   Asthma    Environmental allergies    Hypertension    Past Surgical History:  Procedure Laterality Date   CARDIAC SURGERY     COLOSTOMY REVERSAL Right    during infancy   SMALL INTESTINE SURGERY     childhood   testicular removal     infancy     A IV Location/Drains/Wounds Patient Lines/Drains/Airways Status     Active Line/Drains/Airways     Name Placement date Placement time Site Days   Peripheral IV 12/30/22 20 G 1.88" Anterior;Proximal;Right Forearm 12/30/22  1232  Forearm  less than 1   NG/OG Vented/Dual Lumen 18 Fr. Left nare Marking at nare/corner of  mouth 60 cm 08/18/22  0928  Left nare  134            Intake/Output Last 24 hours No intake or output data in the 24 hours ending 12/30/22 1622  Labs/Imaging Results for orders placed or performed during the hospital encounter of 12/30/22 (from the past 48 hour(s))  CBC with Differential     Status: Abnormal   Collection Time: 12/30/22 12:30 PM  Result Value Ref Range   WBC 10.7 (H) 4.0 - 10.5 K/uL   RBC 4.78 4.22 - 5.81 MIL/uL   Hemoglobin 13.1 13.0 - 17.0 g/dL   HCT 13.0 86.5 - 78.4 %   MCV 89.1 80.0 - 100.0 fL   MCH 27.4 26.0 - 34.0 pg   MCHC 30.8 30.0 - 36.0 g/dL   RDW 69.6 29.5 - 28.4 %   Platelets 247 150 - 400 K/uL   nRBC 0.0 0.0 - 0.2 %   Neutrophils Relative % 67 %   Neutro Abs 7.1 1.7 - 7.7 K/uL   Lymphocytes Relative 25 %   Lymphs Abs 2.7 0.7 - 4.0 K/uL   Monocytes Relative 7 %   Monocytes Absolute 0.7 0.1 - 1.0 K/uL   Eosinophils Relative 1 %   Eosinophils Absolute 0.1 0.0 - 0.5 K/uL   Basophils Relative 0 %   Basophils Absolute 0.0 0.0 - 0.1 K/uL   Immature  Granulocytes 0 %   Abs Immature Granulocytes 0.03 0.00 - 0.07 K/uL    Comment: Performed at [facility], [address] [place], [place] [address]  Basic metabolic panel     Status: Abnormal   Collection Time: 12/30/22 12:30 PM  Result Value Ref Range   Sodium 136 135 - 145 mmol/L   Potassium 3.6 3.5 - 5.1 mmol/L   Chloride 101 98 - 111 mmol/L   CO2 24 22 - 32 mmol/L   Glucose, Bld 137 (H) 70 - 99 mg/dL    Comment: Glucose reference range applies only to samples taken after fasting for at least 8 hours.   BUN 8 6 - 20 mg/dL   Creatinine, Ser 1.91 0.61 - 1.24 mg/dL   Calcium 9.0 8.9 - 47.8 mg/dL   GFR, Estimated >29 >56 mL/min    Comment: (NOTE) Calculated using the CKD-EPI Creatinine Equation (2021)    Anion gap 11 5 - 15    Comment: Performed at [facility], [address], [place], [place] [address]   DG Chest Port 1 View  Result Date: 12/30/2022 CLINICAL DATA:  Asthma and  shortness of breath EXAM: PORTABLE CHEST 1 VIEW COMPARISON:  Chest radiograph dated 03/28/2022 FINDINGS: Hyperinflated lungs. The bilateral costophrenic angles are not included within the field of view. No focal consolidations. No pleural effusion or pneumothorax. The heart size and mediastinal contours are within normal limits. No acute osseous abnormality. IMPRESSION: No acute cardiopulmonary process. The bilateral costophrenic angles are not included within the field of view. Electronically Signed   By: Agustin Cree M.D.   On: 12/30/2022 12:03    Pending Labs Unresulted Labs (From admission, onward)     Start     Ordered   12/31/22 0500  Comprehensive metabolic panel  Tomorrow morning,   R        12/30/22 1540   12/31/22 0500  CBC  Tomorrow morning,   R        12/30/22 1540            Vitals/Pain Today's Vitals   12/30/22 1154 12/30/22 1245 12/30/22 1345 12/30/22 1620  BP:  (!) 126/113 133/84   Pulse:  (!) 123 (!) 106   Resp:  (!) 32 16   Temp:    98 F (36.7 C)  TempSrc:    Oral  SpO2:  92% 95%   Weight: (!) 163 kg     Height: 6' (1.829 m)     PainSc: 2        Isolation Precautions No active isolations  Medications Medications  mometasone-formoterol (DULERA) 100-5 MCG/ACT inhaler 2 puff (has no administration in time range)  predniSONE (DELTASONE) tablet 60 mg (has no administration in time range)  0.9 %  sodium chloride infusion (has no administration in time range)  acetaminophen (TYLENOL) tablet 650 mg (has no administration in time range)  polyethylene glycol (MIRALAX / GLYCOLAX) packet 17 g (has no administration in time range)  ondansetron (ZOFRAN) tablet 4 mg (has no administration in time range)    Or  ondansetron (ZOFRAN) injection 4 mg (has no administration in time range)  multivitamin with minerals tablet 1 tablet (has no administration in time range)  enoxaparin (LOVENOX) injection 80 mg (has no administration in time range)  methylPREDNISolone sodium  succinate (SOLU-MEDROL) 125 mg/2 mL injection 125 mg (125 mg Intravenous Given 12/30/22 1340)  ipratropium-albuterol (DUONEB) 0.5-2.5 (3) MG/3ML nebulizer solution 3 mL (3 mLs Nebulization Given 12/30/22 1340)  Mobility walks     Focused Assessments Pulmonary Assessment Handoff:  Lung sounds:   O2 Device: Nasal Cannula O2 Flow Rate (L/min): 3 L/min    R Recommendations: See Admitting Provider Note  Report given to:   Additional Notes:

## 2022-12-30 NOTE — ED Notes (Signed)
Mani, PA-C increased O2 to 4LNC-at pt's side-[facility] was called to transport pt to ED

## 2022-12-30 NOTE — Progress Notes (Signed)
PHARMACIST - PHYSICIAN COMMUNICATION  CONCERNING:  Enoxaparin (Lovenox) for DVT Prophylaxis    RECOMMENDATION: Patient was prescribed enoxaprin 40mg  q24 hours for VTE prophylaxis.   Filed Weights   12/30/22 1154  Weight: (!) 163 kg (359 lb 5.6 oz)    Body mass index is 48.74 kg/m.  Estimated Creatinine Clearance: 182.2 mL/min (by C-G formula based on SCr of 0.98 mg/dL).   Based on [facility] policy patient is candidate for enoxaparin 0.5mg /kg TBW SQ every 24 hours based on BMI being >30.  DESCRIPTION: Pharmacy has adjusted enoxaparin dose per [facility] policy.  Patient is now receiving enoxaparin 80 mg every 24 hours    Rico Junker, [place] Clinical Pharmacist  12/30/2022 3:46 PM

## 2022-12-30 NOTE — ED Provider Notes (Addendum)
[facility] - URGENT CARE CENTER  Note:  This document was prepared using Conservation officer, historic buildings and may include unintentional dictation errors.  MRN: [id number] DOB: 01-27-1996  Subjective:   Steve Livingston is a 27 y.o. male presenting for 2 day history of recurrent shortness of breath, wheezing, coughing. Patient smokes weed daily. Has Advair that he uses daily. Has been using his albuterol inhaler instead of nebulizer because he has run out of his nebulizer solution. Has a history of respiratory failure with hypoxia requiring hospitalization.   No current facility-administered medications for this encounter.  Current Outpatient Medications:    albuterol (PROVENTIL) (2.5 MG/3ML) 0.083% nebulizer solution, Take 3 mLs (2.5 mg total) by nebulization every 6 (six) hours as needed for wheezing or shortness of breath (if inhaler unable to control symptoms (do not use both)). (Patient not taking: Reported on 11/23/2022), Disp: 75 mL, Rfl: 1   albuterol (VENTOLIN HFA) 108 (90 Base) MCG/ACT inhaler, Inhale 1-2 puffs into the lungs every 6 (six) hours as needed for wheezing or shortness of breath., Disp: 1 each, Rfl: 0   fluticasone-salmeterol (ADVAIR) 100-50 MCG/ACT AEPB, Inhale 1 puff into the lungs 2 (two) times daily. (Patient not taking: Reported on 11/23/2022), Disp: 3 each, Rfl: 0   Polyethylene Glycol 3350 (MIRALAX PO), Take by mouth. (Patient not taking: Reported on 11/23/2022), Disp: , Rfl:    Allergies  Allergen Reactions   Bee Venom Swelling    Past Medical History:  Diagnosis Date   Asthma    Environmental allergies    Hypertension      Past Surgical History:  Procedure Laterality Date   CARDIAC SURGERY     COLOSTOMY REVERSAL Right    during infancy   SMALL INTESTINE SURGERY     childhood   testicular removal     infancy    Family History  Problem Relation Age of Onset   Hypertension Mother    Obesity Mother    Hypertension Father    Hypertension Sister     Hypertension Brother    Cancer Maternal Grandmother    Diabetes Maternal Grandmother    Diabetes Maternal Grandfather    Hypertension Maternal Grandfather    Heart disease Maternal Grandfather    Stroke Maternal Grandfather     Social History   Tobacco Use   Smoking status: Former    Types: Cigarettes    Quit date: 08/23/2015    Years since quitting: 7.3   Smokeless tobacco: Former    Quit date: 08/22/2013  Vaping Use   Vaping Use: Every day   Substances: Nicotine, Flavoring  Substance Use Topics   Alcohol use: Not Currently    Comment: occ   Drug use: Yes    Types: Marijuana    Comment: daily before 03/30/23    ROS   Objective:   Vitals: BP (!) 116/56 (BP Location: Left Arm)   Pulse (!) 117   Temp 98.8 F (37.1 C) (Oral)   Resp (!) 28   SpO2 (!) 82%   Physical Exam Constitutional:      General: He is not in acute distress.    Appearance: Normal appearance. He is well-developed. He is not ill-appearing, toxic-appearing or diaphoretic.  HENT:     Head: Normocephalic and atraumatic.     Right Ear: External ear normal.     Left Ear: External ear normal.     Nose: Nose normal.     Mouth/Throat:     Mouth: Mucous membranes are moist.  Eyes:     General: No scleral icterus.       Right eye: No discharge.        Left eye: No discharge.     Extraocular Movements: Extraocular movements intact.  Cardiovascular:     Rate and Rhythm: Normal rate and regular rhythm.     Heart sounds: Normal heart sounds. No murmur heard.    No friction rub. No gallop.  Pulmonary:     Effort: No respiratory distress.     Breath sounds: No stridor. Wheezing and rhonchi present. No rales.     Comments: Tachypnea at 36 breaths per minute. Neurological:     Mental Status: He is alert and oriented to person, place, and time.  Psychiatric:        Mood and Affect: Mood normal.        Behavior: Behavior normal.        Thought Content: Thought content normal.    A duoneb treatment of  0.5mg -2.5mg  ipratroprium-albuterol was administered with minimal improvement in his oximetry. Remained at 86%%-88%. Supplemental oxygen was provided at 4L which is what the patient could tolerate. Pulse oximetry remained 88%-89%. A second duoneb treatment of 0.5mg -2.5mg  ipratroprium-albuterol was administered thereafter. His oxymetry was 90%-92%. Pulmonary exam remained the same with significant wheezing, rhonchi throughout.  Assessment and Plan :   PDMP not reviewed this encounter.  1. Acute respiratory failure with hypoxia (HCC)   2. Moderate persistent asthma with (acute) exacerbation    Patient is in respiratory failure with hypoxia secondary to a severe asthma exacerbation. The above measure were used and unfortunately the treatment failed.  Given his history and overall minimal response to the measures taken emphasized need for higher level of care than we can provide in the urgent care setting.  EMS was called, patient was agreeable as he has previously had respiratory failure, syncope and hospitalization from his asthma.  Case reported out to EMS.  Transfer of care completed.     Wallis Bamberg, PA-C 12/30/22 1054

## 2022-12-30 NOTE — ED Triage Notes (Signed)
Pt c/o dry cough, wheezing x 2 days-pt with dyspnea noted walking to treatment-states he is out of inhaler and neb

## 2022-12-30 NOTE — ED Provider Notes (Signed)
[place] EMERGENCY DEPARTMENT AT [facility] Provider Note   CSN: [id number] Arrival date & time: 12/30/22  1136     History  Chief Complaint  Patient presents with   Shortness of Breath    Coltyn Beierle is a 27 y.o. male.  27 year old male with prior medical history as detailed below presents for evaluation.  Patient with known history of asthma.  Patient presented to urgent care earlier today with complaint of wheezing and shortness of breath.  Patient was given breathing treatments at urgent care and noted to be hypoxic.  He was referred to the ED for evaluation.  On arrival to the ED the patient feels improved.  He is still tachypneic, mildly hypoxic, and wheezing.  He denies recent fever.  He denies chest pain.  He denies other complaint.  The history is provided by the patient and medical records.       Home Medications Prior to Admission medications   Medication Sig Start Date End Date Taking? Authorizing Provider  albuterol (PROVENTIL) (2.5 MG/3ML) 0.083% nebulizer solution Take 3 mLs (2.5 mg total) by nebulization every 6 (six) hours as needed for wheezing or shortness of breath (if inhaler unable to control symptoms (do not use both)). Patient not taking: Reported on 11/23/2022 03/29/22   Vassie Loll, MD  albuterol (VENTOLIN HFA) 108 (90 Base) MCG/ACT inhaler Inhale 1-2 puffs into the lungs every 6 (six) hours as needed for wheezing or shortness of breath. 11/23/22   Jacquelin Hawking, PA-C  fluticasone-salmeterol (ADVAIR) 100-50 MCG/ACT AEPB Inhale 1 puff into the lungs 2 (two) times daily. Patient not taking: Reported on 11/23/2022 08/24/22   Jacquelin Hawking, PA-C  Polyethylene Glycol 3350 (MIRALAX PO) Take by mouth. Patient not taking: Reported on 11/23/2022    [provider]      Allergies    Bee venom    Review of Systems   Review of Systems  All other systems reviewed and are negative.   Physical Exam Updated Vital Signs BP (!) 147/66 (BP  Location: Right Arm)   Pulse (!) 113   Temp 97.7 F (36.5 C) (Oral)   Resp (!) 30   Ht 6' (1.829 m)   Wt (!) 163 kg   SpO2 94%   BMI 48.74 kg/m  Physical Exam Vitals and nursing note reviewed.  Constitutional:      General: He is not in acute distress.    Appearance: Normal appearance. He is well-developed.  HENT:     Head: Normocephalic and atraumatic.  Eyes:     Conjunctiva/sclera: Conjunctivae normal.     Pupils: Pupils are equal, round, and reactive to light.  Cardiovascular:     Rate and Rhythm: Normal rate and regular rhythm.     Heart sounds: Normal heart sounds.  Pulmonary:     Effort: Tachypnea present. No respiratory distress.     Comments: Mild tachypnea, mild scattered diffuse expiratory wheezes in all lung fields Abdominal:     General: There is no distension.     Palpations: Abdomen is soft.     Tenderness: There is no abdominal tenderness.  Musculoskeletal:        General: No deformity. Normal range of motion.     Cervical back: Normal range of motion and neck supple.  Skin:    General: Skin is warm and dry.  Neurological:     General: No focal deficit present.     Mental Status: He is alert and oriented to person, place, and time.  ED Results / Procedures / Treatments   Labs (all labs ordered are listed, but only abnormal results are displayed) Labs Reviewed  CBC WITH DIFFERENTIAL/PLATELET  BASIC METABOLIC PANEL    EKG EKG Interpretation  Date/Time:  Friday Dec 30 2022 11:46:57 EDT Ventricular Rate:  112 PR Interval:  158 QRS Duration: 105 QT Interval:  332 QTC Calculation: 454 R Axis:   43 Text Interpretation: Sinus tachycardia ST elev, probable normal early repol pattern Confirmed by Kristine Royal [phone]) on 12/30/2022 11:49:56 AM  Radiology DG Chest Port 1 View  Result Date: 12/30/2022 CLINICAL DATA:  Asthma and shortness of breath EXAM: PORTABLE CHEST 1 VIEW COMPARISON:  Chest radiograph dated 03/28/2022 FINDINGS: Hyperinflated  lungs. The bilateral costophrenic angles are not included within the field of view. No focal consolidations. No pleural effusion or pneumothorax. The heart size and mediastinal contours are within normal limits. No acute osseous abnormality. IMPRESSION: No acute cardiopulmonary process. The bilateral costophrenic angles are not included within the field of view. Electronically Signed   By: Agustin Cree M.D.   On: 12/30/2022 12:03    Procedures Procedures    Medications Ordered in ED Medications  methylPREDNISolone sodium succinate (SOLU-MEDROL) 125 mg/2 mL injection 125 mg (has no administration in time range)  ipratropium-albuterol (DUONEB) 0.5-2.5 (3) MG/3ML nebulizer solution 3 mL (has no administration in time range)    ED Course/ Medical Decision Making/ A&P                             Medical Decision Making Amount and/or Complexity of Data Reviewed Labs: ordered. Radiology: ordered.  Risk Prescription drug management. Decision regarding hospitalization.    Medical Screen Complete  This patient presented to the ED with complaint of wheezing, shortness of breath.  This complaint involves an extensive number of treatment options. The initial differential diagnosis includes, but is not limited to, asthma exacerbation  This presentation is: Acute, Chronic, Self-Limited, Previously Undiagnosed, Uncertain Prognosis, Complicated, Systemic Symptoms, and Threat to Life/Bodily Function  Patient presented to urgent care for evaluation of wheezing and shortness of breath.  Patient was noted to be hypoxic with initial room air sats at urgent care of 82%.    Patient given breathing treatments in urgent care and then referred to the ED.  On arrival to the ED patient with persistent bronchospasm.  Solu-Medrol administered.  Additional breathing treatment administered.  Patient is improving.  However, patient without PCP or routine outpatient care.  Patient would benefit from overnight  observation to ensure that he improves.  Hospitalist service made aware of case and will evaluate for admission.  Additional history obtained:  External records from outside sources obtained and reviewed including prior ED visits and prior Inpatient records.    Lab Tests:  I ordered and personally interpreted labs.  The pertinent results include: CBC, BMP   Imaging Studies ordered:  I ordered imaging studies including chest x-ray I independently visualized and interpreted obtained imaging which showed NAD I agree with the radiologist interpretation.   Cardiac Monitoring:  The patient was maintained on a cardiac monitor.  I personally viewed and interpreted the cardiac monitor which showed an underlying rhythm of: Sinus tach   Medicines ordered:  I ordered medication including Solu-Medrol, DuoNeb treatment for bronchospasm Reevaluation of the patient after these medicines showed that the patient: improved   Problem List / ED Course:  Asthma exacerbation   Reevaluation:  After the interventions noted above, I  reevaluated the patient and found that they have: improved  Disposition:  After consideration of the diagnostic results and the patients response to treatment, I feel that the patent would benefit from admission.          Final Clinical Impression(s) / ED Diagnoses Final diagnoses:  Dyspnea, unspecified type  Severe persistent asthma with acute exacerbation    Rx / DC Orders ED Discharge Orders     None         Wynetta Fines, MD 12/30/22 1531

## 2022-12-30 NOTE — ED Notes (Signed)
Patient is being discharged from the Urgent Care and sent to the Emergency Department via [facility] . Per Urban Gibson, [facility], patient is in need of higher level of care due to resp distress/asthma. Patient is aware and verbalizes understanding of plan of care.  Vitals:   12/30/22 1018 12/30/22 1035  BP: (!) 116/56   Pulse: (!) 117   Resp: (!) 28 (!) 28  Temp: 98.8 F (37.1 C)   SpO2: (!) 82% 90%

## 2022-12-30 NOTE — Care Management (Signed)
  Transition of Care (TOC) Screening Note   Patient Details  Name: Steve Livingston Date of Birth: 1995-12-21   Transition of Care [facility]) CM/SW Contact:    Lockie Pares, RN Phone Number: 12/30/2022, 4:28 PM    Transition of Care Department [facility]) has reviewed patient and no TOC needs have been identified at this time. We will continue to monitor patient advancement through interdisciplinary progression rounds. If new patient transition needs arise, please place a TOC consult. PCP on AVS

## 2022-12-30 NOTE — H&P (Signed)
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  He was given appropriate care at the urgent care but was noted to be hypoxic and therefore was transferred to the emergency room for further evaluation.  During his stay in the emergency room with ongoing medical therapy he slowly began to improve but wheezing persisted as did mild hypoxia and it was therefore felt that overnight observation in the hospital was prudent.  Assessment/Plan  Acute asthma exacerbation  Slow to improve despite appropriate care in ED - observe in hospital over night to assure he continues to improve as patient has no reliable PCP to follow him closely as an outpt - utilize duoneb and scheduled steroid - determine most appropriate long acting maintenance inhaler prior to d/c home   Obesity - Body mass index is 48.74 kg/m.  Prediabetes Check A1c given mildly elevated serum glucose  DVT prophylaxis: lovenox Code Status:   Code Status: Full Code Family Communication: no family present at time of admit eval  Disposition Plan:  Admit to Inpatient   Review of Systems: As per HPI otherwise 10 point review of systems negative.   Past Medical History:  Diagnosis Date   Asthma    Environmental allergies    Hypertension     Past Surgical History:  Procedure Laterality Date   CARDIAC SURGERY     COLOSTOMY REVERSAL Right    during infancy   SMALL INTESTINE SURGERY     childhood   testicular removal     infancy    Family History  Family History  Problem Relation Age  of Onset   Hypertension Mother    Obesity Mother    Hypertension Father    Hypertension Sister    Hypertension Brother    Cancer Maternal Grandmother    Diabetes Maternal Grandmother    Diabetes Maternal Grandfather    Hypertension Maternal Grandfather    Heart disease Maternal Grandfather    Stroke Maternal Grandfather     Social History   reports that he quit smoking about 7 years ago. His smoking use included cigarettes. He quit smokeless tobacco use about 9 years ago. He reports that he does not currently use alcohol. He reports current drug use. Drug: Marijuana.  Allergies Allergies  Allergen Reactions   Bee Venom Swelling    Prior to Admission medications   Medication Sig Start Date End Date Taking? Authorizing Provider  albuterol (PROVENTIL) (2.5 MG/3ML) 0.083% nebulizer solution Take 3 mLs (2.5 mg total) by nebulization every 6 (six) hours as needed for wheezing or shortness of breath (if inhaler unable to control symptoms (do not use both)). Patient not taking: Reported on 11/23/2022 03/29/22   Vassie Loll, MD  albuterol (VENTOLIN HFA) 108 (90 Base) MCG/ACT inhaler Inhale 1-2 puffs into the lungs every 6 (six) hours as needed for wheezing or shortness of breath. 11/23/22   Jacquelin Hawking, PA-C  fluticasone-salmeterol (ADVAIR) 100-50 MCG/ACT AEPB Inhale 1 puff into the lungs 2 (two) times daily. Patient not taking: Reported on 11/23/2022 08/24/22   Jacquelin Hawking, PA-C    Physical Exam: Vitals:  12/30/22 1149 12/30/22 1154 12/30/22 1245 12/30/22 1345  BP: (!) 147/66  (!) 126/113 133/84  Pulse: (!) 113  (!) 123 (!) 106  Resp: (!) 30  (!) 32 16  Temp: 97.7 F (36.5 C)     TempSrc: Oral     SpO2: 94%  92% 95%  Weight:  (!) 163 kg    Height:  6' (1.829 m)      Constitutional: NAD, calm, comfortable Eyes: PERRL, lids and conjunctivae normal Respiratory: mild exp wheezing diffusely - good air movement th/o - no prolongation of exp phase - no focal crackles   Cardiovascular: Regular rate and rhythm, no murmurs / rubs / gallops. No extremity edema. Abdomen: No tenderness or masses to palpation. No hepatosplenomegaly. Bowel sounds positive. Not distended. Soft.  Musculoskeletal: No clubbing / cyanosis.  Skin: No rashes, lesions, ulcers.  Neurologic: CN 2-12 grossly intact B. Sensation intact.   Labs on Admission:   CBC: Recent Labs  Lab 12/30/22 1230  WBC 10.7*  NEUTROABS 7.1  HGB 13.1  HCT 42.6  MCV 89.1  PLT 247   Basic Metabolic Panel: Recent Labs  Lab 12/30/22 1230  NA 136  K 3.6  CL 101  CO2 24  GLUCOSE 137*  BUN 8  CREATININE 0.98  CALCIUM 9.0   GFR: Estimated Creatinine Clearance: 182.2 mL/min (by C-G formula based on SCr of 0.98 mg/dL).  Radiological Exams on Admission: DG Chest Port 1 View  Result Date: 12/30/2022 CLINICAL DATA:  Asthma and shortness of breath EXAM: PORTABLE CHEST 1 VIEW COMPARISON:  Chest radiograph dated 03/28/2022 FINDINGS: Hyperinflated lungs. The bilateral costophrenic angles are not included within the field of view. No focal consolidations. No pleural effusion or pneumothorax. The heart size and mediastinal contours are within normal limits. No acute osseous abnormality. IMPRESSION: No acute cardiopulmonary process. The bilateral costophrenic angles are not included within the field of view. Electronically Signed   By: Agustin Cree M.D.   On: 12/30/2022 12:03     Lonia Blood, MD [facility] Office  [phone] Pager - Text Page per Amion as per below:  On-Call/Text Page:      [email or web address]  If 7PM-7AM, please contact night-coverage [email or web address] 12/30/2022, 3:40 PM

## 2022-12-31 DIAGNOSIS — J4542 Moderate persistent asthma with status asthmaticus: Secondary | ICD-10-CM | POA: Diagnosis not present

## 2022-12-31 LAB — CBC
HCT: 40.4 % (ref 39.0–52.0)
Hemoglobin: 12.9 g/dL — ABNORMAL LOW (ref 13.0–17.0)
MCH: 27.7 pg (ref 26.0–34.0)
MCHC: 31.9 g/dL (ref 30.0–36.0)
MCV: 86.7 fL (ref 80.0–100.0)
Platelets: 280 10*3/uL (ref 150–400)
RBC: 4.66 MIL/uL (ref 4.22–5.81)
RDW: 12.6 % (ref 11.5–15.5)
WBC: 9.6 10*3/uL (ref 4.0–10.5)
nRBC: 0 % (ref 0.0–0.2)

## 2022-12-31 LAB — COMPREHENSIVE METABOLIC PANEL
ALT: 22 U/L (ref 0–44)
AST: 22 U/L (ref 15–41)
Albumin: 3.8 g/dL (ref 3.5–5.0)
Alkaline Phosphatase: 55 U/L (ref 38–126)
Anion gap: 9 (ref 5–15)
BUN: 10 mg/dL (ref 6–20)
CO2: 25 mmol/L (ref 22–32)
Calcium: 9.4 mg/dL (ref 8.9–10.3)
Chloride: 102 mmol/L (ref 98–111)
Creatinine, Ser: 0.74 mg/dL (ref 0.61–1.24)
GFR, Estimated: >60 mL/min (ref >60)
Glucose, Bld: 187 mg/dL — ABNORMAL HIGH (ref 70–99)
Potassium: 4.7 mmol/L (ref 3.5–5.1)
Sodium: 136 mmol/L (ref 135–145)
Total Bilirubin: 1 mg/dL (ref 0.3–1.2)
Total Protein: 7.8 g/dL (ref 6.5–8.1)

## 2022-12-31 LAB — HEMOGLOBIN A1C
Hgb A1c MFr Bld: 6.7 % — ABNORMAL HIGH (ref 4.8–5.6)
Mean Plasma Glucose: 145.59 mg/dL

## 2022-12-31 MED ORDER — ALBUTEROL SULFATE HFA 108 (90 BASE) MCG/ACT IN AERS: 1.0000 | INHALATION_SPRAY | Freq: Four times a day (QID) | RESPIRATORY_TRACT | 2 refills | Status: AC | PRN

## 2022-12-31 MED ORDER — ACETAMINOPHEN 325 MG PO TABS: 650.0000 mg | ORAL_TABLET | Freq: Four times a day (QID) | ORAL | Status: AC | PRN

## 2022-12-31 MED ORDER — CETIRIZINE HCL 10 MG PO TABS: 10.0000 mg | ORAL_TABLET | Freq: Every day | ORAL | 2 refills | Status: AC

## 2022-12-31 MED ORDER — MOMETASONE FURO-FORMOTEROL FUM 100-5 MCG/ACT IN AERO: INHALATION_SPRAY | RESPIRATORY_TRACT | 2 refills | Status: AC

## 2022-12-31 MED ORDER — PREDNISONE 20 MG PO TABS: 60.0000 mg | ORAL_TABLET | Freq: Every day | ORAL | 0 refills | Status: AC

## 2022-12-31 NOTE — Discharge Summary (Signed)
DISCHARGE SUMMARY  Steve Livingston  MR#: [id number]  DOB:1996-04-18  Date of Admission: 12/30/2022 Date of Discharge: 12/31/2022  Attending Physician:Mckenlee Mangham Silvestre Gunner, MD  Patient's PCP:Pcp, No  Consults: none   Disposition: D/C home   Follow-up Appts:  Follow-up Information     [facility] Follow up.   Specialty: Family Medicine Why: Please clal and make a appointment to establish a primary care doctor. Contact information: [address], [address] [place] [address] [phone]                Tests Needing Follow-up: -Assess for control of asthma symptoms on current therapy -F/U elevated A1c and possible need to start DM meds   Discharge Diagnoses: Acute asthma exacerbation  Obesity - Body mass index is 48.74 kg/m. 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  He was given appropriate care at the urgent care but was noted to be hypoxic and therefore was transferred to the emergency room for further evaluation.  During his stay in the emergency room with ongoing medical therapy he slowly began to improve but wheezing persisted as did mild hypoxia and it was therefore felt that overnight observation in the hospital was prudent.   Hospital Course:  Acute asthma exacerbation  Was initially slow to improve despite appropriate care in ED - observed in hospital over night - utilized duoneb and scheduled systemic steroid -patient steadily improved during his hospital stay and on the date of discharge was without wheeze or respiratory distress and had been successfully weaned to room air -he is discharged on an ICS plus formoterol  to be used initially on scheduled for 7 days and then transition to a as needed basis -he is also provided with albuterol MDI to be used as needed during his initial 7 days of scheduled combination therapy -he is advised to use a systemic antihistamine and the importance of routine scheduled follow-up is stressed to him   Obesity - Body mass index is 48.74 kg/m.   Prediabetes A1c suggestive of true DM at this time - CBG controlled during this hospital stay - outpt f/u suggested once off steroids to determine baseline CBGs and decide if initiation of oral diabetes medication is necessary    Allergies as of 12/31/2022       Reactions   Bee Venom Swelling        Medication List     TAKE these medications    acetaminophen 325 MG tablet Commonly known as: TYLENOL Take 2 tablets (650 mg total) by mouth every 6 (six) hours as needed for mild pain, fever or headache.   albuterol 108 (90 Base) MCG/ACT inhaler Commonly known as: VENTOLIN HFA Inhale 1-2 puffs into the lungs every 6 (six) hours as needed for wheezing or shortness of breath. What changed:  how much to take when to take this reasons to take this Another medication with the same name was removed. Continue taking this medication, and follow the directions you see here.   cetirizine 10 MG tablet Commonly known as: ZyrTEC Allergy Take 1 tablet (10 mg total) by mouth daily.   Creatine Powd Take 1 Scoop by mouth See admin instructions. 1 scoop 3 times a week as needed for workout  recovery.   mometasone-formoterol 100-5 MCG/ACT Aero Commonly known as: DULERA Inhale 2 puffs twice a day on schedule for the next 7 days, then change to 2 puffs twice a day as needed if you are having wheezing. Replaces: fluticasone-salmeterol 100-50 MCG/ACT Aepb   predniSONE 20 MG tablet Commonly known as: DELTASONE Take 3 tablets (60 mg total) by mouth daily with breakfast for 4 days. Start taking on: Jan 01, 2023        Day of  Discharge BP 129/83 (BP Location: Left Arm)   Pulse 84   Temp 98 F (36.7 C) (Oral)   Resp 17   Ht 6' (1.829 m)   Wt (!) 163 kg   SpO2 92%   BMI 48.74 kg/m   Physical Exam: General: No acute respiratory distress Lungs: Clear to auscultation bilaterally without wheezes or crackles Cardiovascular: Regular rate and rhythm without murmur gallop or rub normal S1 and S2 Abdomen: Nontender, nondistended, soft, bowel sounds positive, no rebound, no ascites, no appreciable mass Extremities: No significant cyanosis, clubbing, or edema bilateral lower extremities  Basic Metabolic Panel: Recent Labs  Lab 12/30/22 1230 12/31/22 0152  NA 136 136  K 3.6 4.7  CL 101 102  CO2 24 25  GLUCOSE 137* 187*  BUN 8 10  CREATININE 0.98 0.74  CALCIUM 9.0 9.4   CBC: Recent Labs  Lab 12/30/22 1230 12/31/22 0152  WBC 10.7* 9.6  NEUTROABS 7.1  --   HGB 13.1 12.9*  HCT 42.6 40.4  MCV 89.1 86.7  PLT 247 280    Time spent in discharge (includes decision making & examination of pt): 30 minutes  12/31/2022, 11:20 AM   Lonia Blood, MD [facility] Office  [phone]

## 2022-12-31 NOTE — Plan of Care (Signed)

## 2024-09-26 ENCOUNTER — Telehealth: Payer: Self-pay

## 2024-09-26 NOTE — Telephone Encounter (Signed)
 Left message making patient aware that because he is not established with our office yet, we are unable to refill any of his medications. He would need to contact the office that last prescribed his meds and request them to refill, as we cannot do that.    Copied from CRM #[id number]. Topic: Clinical - Medication Question >> Sep 26, 2024  9:03 AM Ahlexyia S wrote: Reason for CRM: Pt called in stating that he is running out of his inhaler. Pt has an appointment scheduled on 03/23 for a new pt OV. I advised pt that it may be unable to fill due to him never being seen at clinic before. Pt stated he originally sees urgent care for refilling needs. Pt is unsure of his next steps so he is requesting a callback regarding this.

## 2024-11-11 ENCOUNTER — Ambulatory Visit: Admitting: Family Medicine
